# Patient Record
Sex: Female | Born: 1975 | Race: Black or African American | Hispanic: No | Marital: Single | Smoking: Former smoker
Health system: Southern US, Community
[De-identification: ages and names within clinical notes are randomized; demographics above are authoritative.]

## PROBLEM LIST (undated history)

## (undated) DIAGNOSIS — I10 Essential (primary) hypertension: Secondary | ICD-10-CM

## (undated) DIAGNOSIS — J45909 Unspecified asthma, uncomplicated: Secondary | ICD-10-CM

## (undated) DIAGNOSIS — J449 Chronic obstructive pulmonary disease, unspecified: Secondary | ICD-10-CM

---

## 2012-04-06 ENCOUNTER — Encounter (HOSPITAL_BASED_OUTPATIENT_CLINIC_OR_DEPARTMENT_OTHER): Payer: Self-pay

## 2012-04-06 ENCOUNTER — Emergency Department (HOSPITAL_BASED_OUTPATIENT_CLINIC_OR_DEPARTMENT_OTHER)
Admission: EM | Admit: 2012-04-06 | Discharge: 2012-04-06 | Disposition: A | Discharge status: Home / Self Care | Payer: Medicaid Other | Attending: Emergency Medicine | Admitting: Emergency Medicine

## 2012-04-06 ENCOUNTER — Emergency Department (HOSPITAL_BASED_OUTPATIENT_CLINIC_OR_DEPARTMENT_OTHER): Payer: Medicaid Other

## 2012-04-06 DIAGNOSIS — S93409A Sprain of unspecified ligament of unspecified ankle, initial encounter: Secondary | ICD-10-CM | POA: Insufficient documentation

## 2012-04-06 DIAGNOSIS — W010XXA Fall on same level from slipping, tripping and stumbling without subsequent striking against object, initial encounter: Secondary | ICD-10-CM | POA: Insufficient documentation

## 2012-04-06 DIAGNOSIS — S93401A Sprain of unspecified ligament of right ankle, initial encounter: Secondary | ICD-10-CM

## 2012-04-06 DIAGNOSIS — M25579 Pain in unspecified ankle and joints of unspecified foot: Secondary | ICD-10-CM | POA: Insufficient documentation

## 2012-04-06 MED ORDER — TRAMADOL HCL 50 MG PO TABS: 50.0000 mg | ORAL_TABLET | Freq: Four times a day (QID) | ORAL | Status: AC | PRN

## 2012-04-06 MED ORDER — KETOROLAC TROMETHAMINE 60 MG/2ML IM SOLN
60.0000 mg | Freq: Once | INTRAMUSCULAR | Status: AC
  Administered 2012-04-06: 60 mg via INTRAMUSCULAR
  Filled 2012-04-06: qty 2

## 2012-04-06 MED ORDER — IBUPROFEN 600 MG PO TABS: 600.0000 mg | ORAL_TABLET | Freq: Four times a day (QID) | ORAL | Status: AC | PRN

## 2012-04-06 NOTE — ED Notes (Signed)
MD at bedside. 

## 2012-04-06 NOTE — ED Notes (Signed)
Tripped early am-right foot pain/swelling

## 2012-04-06 NOTE — ED Provider Notes (Signed)
History     CSN: [id number]  Arrival date & time 04/06/12  1931   First MD Initiated Contact with Patient 04/06/12 2013      Chief Complaint  Patient presents with  . Foot Injury    (Consider location/radiation/quality/duration/timing/severity/associated sxs/prior treatment) HPI Pt turned R ankle last night and woke with lateral ankle pain and foot swelling. Pt has been ambulatory.  History reviewed. No pertinent past medical history.  History reviewed. No pertinent past surgical history.  No family history on file.  History  Substance Use Topics  . Smoking status: Current Everyday Smoker  . Smokeless tobacco: Not on file  . Alcohol Use: No    OB History    Grav Para Term Preterm Abortions TAB SAB Ect Mult Living                  Review of Systems  Musculoskeletal: Positive for arthralgias.  Skin: Negative for rash and wound.    Allergies  Review of patient's allergies indicates not on file.  Home Medications   Current Outpatient Rx  Name Route Sig Dispense Refill  . IBUPROFEN 600 MG PO TABS Oral Take 1 tablet (600 mg total) by mouth every 6 (six) hours as needed for pain. 30 tablet 0  . TRAMADOL HCL 50 MG PO TABS Oral Take 1 tablet (50 mg total) by mouth every 6 (six) hours as needed for pain. 15 tablet 0    Pulse 75  Temp 98.5 F (36.9 C) (Oral)  Resp 16  Ht 5\' 8"  (1.727 m)  Wt 170 lb (77.111 kg)  BMI 25.85 kg/m2  SpO2 100%  LMP 03/29/2012  Physical Exam  Nursing note and vitals reviewed. Constitutional: She is oriented to person, place, and time. She appears well-developed and well-nourished. No distress.  HENT:  Head: Normocephalic and atraumatic.  Neck: Normal range of motion. Neck supple.  Pulmonary/Chest: Effort normal.  Abdominal: Soft. Bowel sounds are normal.  Musculoskeletal: Normal range of motion. She exhibits tenderness (TTP over R lat mal and distal to lat mal. No deformity noted. + swelling to lateral foot. Neurovasc intact). She  exhibits no edema.  Neurological: She is alert and oriented to person, place, and time.  Skin: Skin is warm and dry. No rash noted. No erythema.  Psychiatric: She has a normal mood and affect. Her behavior is normal.    ED Course  Procedures (including critical care time)  Labs Reviewed - No data to display Dg Ankle Complete Right  04/06/2012  *RADIOLOGY REPORT*  Clinical Data: Fall with right ankle pain.  RIGHT ANKLE - COMPLETE 3+ VIEW  Comparison: None.  Findings: Soft tissue swelling is present laterally.  No fracture or dislocation.  Ankle mortise shows normal alignment.  No significant degenerative changes.  No bony lesions.  IMPRESSION: No acute fracture.  Original Report Authenticated By: Reola Calkins, M.D.   Dg Foot Complete Right  04/06/2012  *RADIOLOGY REPORT*  Clinical Data: Fall with right foot pain.  RIGHT FOOT COMPLETE - 3+ VIEW  Comparison:  None.  Findings:  There is no evidence of fracture or dislocation.  There is no evidence of arthropathy or other focal bone abnormality. Soft tissues are unremarkable.  IMPRESSION: Negative.  Original Report Authenticated By: Reola Calkins, M.D.     1. Right ankle sprain       MDM          Loren Racer, MD 04/06/12 2044

## 2012-09-22 ENCOUNTER — Encounter (HOSPITAL_BASED_OUTPATIENT_CLINIC_OR_DEPARTMENT_OTHER): Payer: Self-pay | Admitting: *Deleted

## 2012-09-22 DIAGNOSIS — I10 Essential (primary) hypertension: Secondary | ICD-10-CM | POA: Insufficient documentation

## 2012-09-22 DIAGNOSIS — W208XXA Other cause of strike by thrown, projected or falling object, initial encounter: Secondary | ICD-10-CM | POA: Insufficient documentation

## 2012-09-22 DIAGNOSIS — Y939 Activity, unspecified: Secondary | ICD-10-CM | POA: Insufficient documentation

## 2012-09-22 DIAGNOSIS — Y929 Unspecified place or not applicable: Secondary | ICD-10-CM | POA: Insufficient documentation

## 2012-09-22 DIAGNOSIS — S90129A Contusion of unspecified lesser toe(s) without damage to nail, initial encounter: Secondary | ICD-10-CM | POA: Insufficient documentation

## 2012-09-22 DIAGNOSIS — F17200 Nicotine dependence, unspecified, uncomplicated: Secondary | ICD-10-CM | POA: Insufficient documentation

## 2012-09-22 NOTE — ED Notes (Signed)
Pt states she dropped a jar on her right great toe earlier today. C/O pain to same.

## 2012-09-23 ENCOUNTER — Emergency Department (HOSPITAL_BASED_OUTPATIENT_CLINIC_OR_DEPARTMENT_OTHER): Payer: Medicaid Other

## 2012-09-23 ENCOUNTER — Emergency Department (HOSPITAL_BASED_OUTPATIENT_CLINIC_OR_DEPARTMENT_OTHER)
Admission: EM | Admit: 2012-09-23 | Discharge: 2012-09-23 | Disposition: A | Discharge status: Home / Self Care | Payer: Medicaid Other | Attending: Emergency Medicine | Admitting: Emergency Medicine

## 2012-09-23 DIAGNOSIS — S90129A Contusion of unspecified lesser toe(s) without damage to nail, initial encounter: Secondary | ICD-10-CM

## 2012-09-23 HISTORY — DX: Essential (primary) hypertension: I10

## 2012-09-23 MED ORDER — NAPROXEN 375 MG PO TABS: 375.0000 mg | ORAL_TABLET | Freq: Two times a day (BID) | ORAL | Status: DC

## 2012-09-23 NOTE — ED Notes (Signed)
Returned from xray

## 2012-09-23 NOTE — ED Notes (Signed)
Transported to xray 

## 2012-09-23 NOTE — ED Provider Notes (Signed)
History     CSN: [id number]  Arrival date & time 09/22/12  2211   First MD Initiated Contact with Patient 09/23/12 0053      Chief Complaint  Patient presents with  . Toe Injury    (Consider location/radiation/quality/duration/timing/severity/associated sxs/prior treatment) Patient is a 37 y.o. female presenting with toe pain. The history is provided by the patient.  Toe Pain This is a new problem. The current episode started 3 to 5 hours ago. The problem occurs constantly. The problem has not changed since onset.Pertinent negatives include no chest pain, no abdominal pain, no headaches and no shortness of breath. Nothing aggravates the symptoms. Nothing relieves the symptoms. She has tried nothing for the symptoms. The treatment provided no relief.  Dropped a jar on great toe  Past Medical History  Diagnosis Date  . Hypertension     History reviewed. No pertinent past surgical history.  History reviewed. No pertinent family history.  History  Substance Use Topics  . Smoking status: Current Every Day Smoker  . Smokeless tobacco: Not on file  . Alcohol Use: No    OB History    Grav Para Term Preterm Abortions TAB SAB Ect Mult Living                  Review of Systems  Respiratory: Negative for shortness of breath.   Cardiovascular: Negative for chest pain.  Gastrointestinal: Negative for abdominal pain.  Neurological: Negative for headaches.  All other systems reviewed and are negative.    Allergies  Review of patient's allergies indicates not on file.  Home Medications  No current outpatient prescriptions on file.  BP 153/105  Pulse 65  Temp 98.8 F (37.1 C) (Oral)  Resp 23  Ht 5\' 6"  (1.676 m)  Wt 170 lb (77.111 kg)  BMI 27.44 kg/m2  SpO2 100%  LMP 09/12/2012  Physical Exam  Constitutional: She is oriented to person, place, and time. She appears well-developed and well-nourished. No distress.  HENT:  Head: Normocephalic and atraumatic.  Eyes:  Conjunctivae normal are normal. Pupils are equal, round, and reactive to light.  Neck: Normal range of motion. Neck supple.  Cardiovascular: Normal rate, regular rhythm and intact distal pulses.   Pulmonary/Chest: Effort normal and breath sounds normal. She has no wheezes. She has no rales.  Abdominal: Soft. Bowel sounds are normal. There is no tenderness. There is no rebound and no guarding.  Musculoskeletal: Normal range of motion. She exhibits no edema.  Neurological: She is alert and oriented to person, place, and time. She has normal reflexes.  Skin: Skin is warm and dry.  Psychiatric: She has a normal mood and affect.    ED Course  Procedures (including critical care time)  Labs Reviewed - No data to display Dg Toe Great Right  09/23/2012  *RADIOLOGY REPORT*  Clinical Data: Dropped jar on right great toe, with pain at the lateral aspect of the right great toe.  RIGHT GREAT TOE  Comparison: None.  Findings: There is no evidence of osseous disruption.  The nailbed is difficult to fully characterize on radiograph.  No radiopaque foreign bodies are seen.  Mild soft tissue swelling is suggested at the distal great toe.  Visualized joint spaces are preserved.  IMPRESSION: No evidence of osseous disruption; no radiopaque foreign bodies seen.   Original Report Authenticated By: Tonia Ghent, M.D.      No diagnosis found.    MDM  Toe contusion.  Pain medication ice and elevation  Jasmine Awe, MD 09/23/12 [phone]

## 2013-07-24 ENCOUNTER — Other Ambulatory Visit: Payer: Self-pay | Admitting: Emergency Medicine

## 2013-07-24 ENCOUNTER — Emergency Department (HOSPITAL_BASED_OUTPATIENT_CLINIC_OR_DEPARTMENT_OTHER): Payer: Medicaid Other

## 2013-07-24 ENCOUNTER — Encounter (HOSPITAL_COMMUNITY): Payer: Self-pay | Admitting: *Deleted

## 2013-07-24 ENCOUNTER — Inpatient Hospital Stay (HOSPITAL_BASED_OUTPATIENT_CLINIC_OR_DEPARTMENT_OTHER)
Admission: EM | Admit: 2013-07-24 | Discharge: 2013-07-27 | DRG: 189 | Disposition: A | Discharge status: Home / Self Care | Payer: Medicaid Other | Attending: Internal Medicine | Admitting: Internal Medicine

## 2013-07-24 DIAGNOSIS — J9600 Acute respiratory failure, unspecified whether with hypoxia or hypercapnia: Principal | ICD-10-CM

## 2013-07-24 DIAGNOSIS — R05 Cough: Secondary | ICD-10-CM

## 2013-07-24 DIAGNOSIS — R0600 Dyspnea, unspecified: Secondary | ICD-10-CM

## 2013-07-24 DIAGNOSIS — R0602 Shortness of breath: Secondary | ICD-10-CM

## 2013-07-24 DIAGNOSIS — R0902 Hypoxemia: Secondary | ICD-10-CM

## 2013-07-24 DIAGNOSIS — F17200 Nicotine dependence, unspecified, uncomplicated: Secondary | ICD-10-CM | POA: Diagnosis present

## 2013-07-24 DIAGNOSIS — J45909 Unspecified asthma, uncomplicated: Secondary | ICD-10-CM | POA: Diagnosis present

## 2013-07-24 DIAGNOSIS — R059 Cough, unspecified: Secondary | ICD-10-CM

## 2013-07-24 DIAGNOSIS — I10 Essential (primary) hypertension: Secondary | ICD-10-CM | POA: Diagnosis present

## 2013-07-24 DIAGNOSIS — I7779 Dissection of other artery: Secondary | ICD-10-CM

## 2013-07-24 DIAGNOSIS — Z720 Tobacco use: Secondary | ICD-10-CM

## 2013-07-24 DIAGNOSIS — R0609 Other forms of dyspnea: Secondary | ICD-10-CM

## 2013-07-24 LAB — PRO B NATRIURETIC PEPTIDE: Pro B Natriuretic peptide (BNP): 409.1 pg/mL — ABNORMAL HIGH (ref 0–125)

## 2013-07-24 MED ORDER — ALBUTEROL SULFATE (5 MG/ML) 0.5% IN NEBU
2.5000 mg | INHALATION_SOLUTION | RESPIRATORY_TRACT | Status: AC
  Administered 2013-07-24: 5 mg via RESPIRATORY_TRACT

## 2013-07-24 MED ORDER — ONDANSETRON HCL 4 MG PO TABS: 4.0000 mg | ORAL_TABLET | Freq: Four times a day (QID) | ORAL | Status: DC | PRN

## 2013-07-24 MED ORDER — SODIUM CHLORIDE 0.9 % IJ SOLN
3.0000 mL | Freq: Two times a day (BID) | INTRAMUSCULAR | Status: DC
  Administered 2013-07-24 (×2): 3 mL via INTRAVENOUS

## 2013-07-24 MED ORDER — ONDANSETRON HCL 4 MG/2ML IJ SOLN: 4.0000 mg | Freq: Four times a day (QID) | INTRAMUSCULAR | Status: DC | PRN

## 2013-07-24 MED ORDER — SODIUM CHLORIDE 0.9 % IJ SOLN: 3.0000 mL | INTRAMUSCULAR | Status: DC | PRN

## 2013-07-24 MED ORDER — IPRATROPIUM BROMIDE 0.02 % IN SOLN
RESPIRATORY_TRACT | Status: AC
  Administered 2013-07-24: 0.5 mg via RESPIRATORY_TRACT
  Filled 2013-07-24: qty 2.5

## 2013-07-24 MED ORDER — ALBUTEROL SULFATE (5 MG/ML) 0.5% IN NEBU
2.5000 mg | INHALATION_SOLUTION | RESPIRATORY_TRACT | Status: DC | PRN
  Administered 2013-07-24 – 2013-07-25 (×3): 2.5 mg via RESPIRATORY_TRACT
  Filled 2013-07-24 (×5): qty 0.5

## 2013-07-24 MED ORDER — SODIUM CHLORIDE 0.9 % IJ SOLN
3.0000 mL | Freq: Two times a day (BID) | INTRAMUSCULAR | Status: DC
  Administered 2013-07-25 – 2013-07-26 (×2): 3 mL via INTRAVENOUS

## 2013-07-24 MED ORDER — BENZONATATE 100 MG PO CAPS
100.0000 mg | ORAL_CAPSULE | Freq: Three times a day (TID) | ORAL | Status: DC | PRN
  Administered 2013-07-24 – 2013-07-25 (×3): 100 mg via ORAL
  Filled 2013-07-24 (×4): qty 1

## 2013-07-24 MED ORDER — ACETAMINOPHEN 325 MG PO TABS
650.0000 mg | ORAL_TABLET | Freq: Four times a day (QID) | ORAL | Status: DC | PRN
  Administered 2013-07-24 – 2013-07-25 (×2): 650 mg via ORAL
  Filled 2013-07-24 (×2): qty 2

## 2013-07-24 MED ORDER — IOHEXOL 350 MG/ML SOLN
80.0000 mL | Freq: Once | INTRAVENOUS | Status: AC | PRN
  Administered 2013-07-24: 80 mL via INTRAVENOUS

## 2013-07-24 MED ORDER — SODIUM CHLORIDE 0.9 % IV SOLN: 250.0000 mL | INTRAVENOUS | Status: DC | PRN

## 2013-07-24 MED ORDER — RACEPINEPHRINE HCL 2.25 % IN NEBU
0.5000 mL | INHALATION_SOLUTION | Freq: Once | RESPIRATORY_TRACT | Status: AC
  Administered 2013-07-24: 0.5 mL via RESPIRATORY_TRACT

## 2013-07-24 MED ORDER — ALBUTEROL SULFATE (5 MG/ML) 0.5% IN NEBU
2.5000 mg | INHALATION_SOLUTION | Freq: Once | RESPIRATORY_TRACT | Status: AC
  Administered 2013-07-24: 2.5 mg via RESPIRATORY_TRACT

## 2013-07-24 MED ORDER — IPRATROPIUM BROMIDE 0.02 % IN SOLN
0.5000 mg | RESPIRATORY_TRACT | Status: AC
  Administered 2013-07-24: 0.5 mg via RESPIRATORY_TRACT

## 2013-07-24 MED ORDER — IPRATROPIUM BROMIDE 0.02 % IN SOLN
0.5000 mg | RESPIRATORY_TRACT | Status: DC
  Administered 2013-07-24 – 2013-07-25 (×5): 0.5 mg via RESPIRATORY_TRACT
  Filled 2013-07-24 (×4): qty 2.5

## 2013-07-24 MED ORDER — SALINE SPRAY 0.65 % NA SOLN
1.0000 | NASAL | Status: DC | PRN
  Administered 2013-07-25: 1 via NASAL
  Filled 2013-07-24 (×2): qty 44

## 2013-07-24 MED ORDER — ALBUTEROL SULFATE (5 MG/ML) 0.5% IN NEBU: 2.5000 mg | INHALATION_SOLUTION | RESPIRATORY_TRACT | Status: DC | PRN

## 2013-07-24 MED ORDER — RACEPINEPHRINE HCL 2.25 % IN NEBU
INHALATION_SOLUTION | RESPIRATORY_TRACT | Status: AC
  Administered 2013-07-24: 0.5 mL via RESPIRATORY_TRACT
  Filled 2013-07-24: qty 0.5

## 2013-07-24 MED ORDER — ALBUTEROL SULFATE (5 MG/ML) 0.5% IN NEBU
5.0000 mg | INHALATION_SOLUTION | Freq: Once | RESPIRATORY_TRACT | Status: AC
  Administered 2013-07-24: 5 mg via RESPIRATORY_TRACT

## 2013-07-24 MED ORDER — METHYLPREDNISOLONE SODIUM SUCC 125 MG IJ SOLR
60.0000 mg | Freq: Four times a day (QID) | INTRAMUSCULAR | Status: DC
  Administered 2013-07-24 – 2013-07-25 (×4): 60 mg via INTRAVENOUS
  Filled 2013-07-24: qty 0.96
  Filled 2013-07-24: qty 2
  Filled 2013-07-24 (×8): qty 0.96
  Filled 2013-07-24: qty 2
  Filled 2013-07-24 (×2): qty 0.96

## 2013-07-24 NOTE — ED Notes (Signed)
Plan for this patient to have her admitted to the hospital. Currently getting CT angio of chest.

## 2013-07-24 NOTE — ED Notes (Signed)
[facility] at bedside 

## 2013-07-24 NOTE — H&P (Signed)
[facility] History and Physical  [name] [name] ZOX:[id number] DOB: 1975/10/07 DOA: 07/24/2013  Referring physician: er PCP: No primary provider on file.  Specialists:   Chief Complaint: sob/cough  HPI: [name] [name] is a 37 y.o. female  Who presents with 2 days of SOB.  She is a smoker but has been unable to smoke due to SOB.  No fevers, no chills.  No chest pain but patient did have epigastric pain.  That comes and goes and she describes as "burning".   She also had n/v 2 days ago.  In the Er, she was found to be 85% on room air.  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  [facility] and acute resp failure    Review of Systems: all systems reviewed, negative unless stated above   Past Medical History  Diagnosis Date  . Hypertension    History reviewed. No pertinent past surgical history. Social History:  reports that she has been smoking Cigarettes.  She has been smoking about 0.50 packs per day. She has never used smokeless tobacco. She reports that she does not drink alcohol or use illicit drugs.  No Known Allergies  History reviewed. No pertinent family history.  Prior to Admission medications   Medication Sig Start Date End Date Taking? Authorizing Provider  naproxen (NAPROSYN) 375 MG tablet Take 1 tablet (375 mg total) by mouth 2 (two) times daily. 09/23/12   April Smitty Cords, MD   Physical Exam: [name] [name] Vitals:   07/24/13 0633  BP: 122/73  Pulse: 63  Temp: 98.3 F (36.8 C)  Resp:      General:  A+Ox3, NAD- on 2L O2  Eyes: wnl  ENT: petechiae of palate  Neck: supple  Cardiovascular: rrr  Respiratory: decreased BS and b/L wheezing  Abdomen: +BS, soft, NT  Skin: no rashes or lesions  Musculoskeletal: moves all 4 ext  Psychiatric: normal mood affect  Neurologic: CN 2-12 intact  Labs on Admission:  Basic Metabolic Panel: No  results found for this basename: NA, K, CL, CO2, GLUCOSE, BUN, CREATININE, CALCIUM, MG, PHOS,  in the last 168 hours Liver Function Tests: No results found for this basename: AST, ALT, ALKPHOS, BILITOT, PROT, ALBUMIN,  in the last 168 hours No results found for this basename: LIPASE, AMYLASE,  in the last 168 hours No results found for this basename: AMMONIA,  in the last 168 hours CBC: No results found for this basename: WBC, NEUTROABS, HGB, HCT, MCV, PLT,  in the last 168 hours Cardiac Enzymes: No results found for this basename: CKTOTAL, CKMB, CKMBINDEX, TROPONINI,  in the last 168 hours  BNP (last 3 results)  Recent Labs  07/24/13 0250  PROBNP 409.1*   CBG: No results found for this basename: GLUCAP,  in the last 168 hours  Radiological Exams on Admission: Dg Neck Soft Tissue  07/24/2013   CLINICAL DATA:  Cough, wheezing, shortness of breath, current smoker  EXAM: NECK SOFT TISSUES - 1+ VIEW  COMPARISON:  None  FINDINGS: Prevertebral soft tissues normal thickness.  Epiglottis normal thickness.  Aryepiglottic folds appear questionably minimally thickened.  Airway patent.  Osseous structures unremarkable.  No radiopaque foreign body identified.  IMPRESSION: Question mild thickening of the aryepiglottic folds.  Remainder of exam unremarkable.   Electronically Signed   By: Ulyses Southward M.D.   On: 07/24/2013 01:39   Dg Chest 2  View  07/24/2013   CLINICAL DATA:  Cough, shortness of breath and wheezing.  EXAM: CHEST  2 VIEW  COMPARISON:  None available for comparison at time of study interpretation.  FINDINGS: Cardiomediastinal silhouette is unremarkable. The lungs are clear without pleural effusions or focal consolidations. Pulmonary vasculature is unremarkable. Trachea projects midline and there is no pneumothorax. Soft tissue planes and included osseous structures are nonsuspicious. Multiple EKG lines overlie the patient and may obscure subtle underlying pathology.  IMPRESSION: No acute  cardiopulmonary process.   Electronically Signed   By: Awilda Metro   On: 07/24/2013 01:41   Ct Angio Chest Pe W/cm &/or Wo Cm  07/24/2013   CLINICAL DATA:  Shortness of breath.  EXAM: CT ANGIOGRAPHY CHEST WITH CONTRAST  TECHNIQUE: Multidetector CT imaging of the chest was performed using the standard protocol during bolus administration of intravenous contrast. Multiplanar CT image reconstructions including MIPs were obtained to evaluate the vascular anatomy.  CONTRAST:  80mL OMNIPAQUE IOHEXOL 350 MG/ML SOLN  COMPARISON:  None.  FINDINGS: THORACIC INLET/BODY WALL:  No acute abnormality.  MEDIASTINUM:  Normal heart size. No pericardial effusion. No acute vascular abnormality, including pulmonary embolism. Incidental aortic ductus bump. No adenopathy.  LUNG WINDOWS:  No consolidation.  No effusion.  No suspicious pulmonary nodule.  UPPER ABDOMEN:  There is a linear filling defect within the celiac axis which is most consistent with a dissection. This section of the artery is mildly dilated to 11 mm diameter. No related flow limitation. No evidence of visceral ischemia in the upper abdomen.  OSSEOUS:  Negative.  Review of the MIP images confirms the above findings.  IMPRESSION: 1. Negative for pulmonary embolism or other acute intrathoracic abnormality. 2. 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 Is there history of connective tissue disease or remote abdominal trauma?   Electronically Signed   By: Tiburcio Pea M.D.   On: 07/24/2013 03:44      Assessment/Plan Active Problems:   SOB (shortness of breath)   Cough   Acute respiratory failure   Tobacco abuse   1. SOB- IV steroids, nebs, NP swab 2. Cough- symptomatic treatment 3. Acute resp failure- O2- wean as tolerated 4. foacl dissection of celiac axis- vascular- no further intervention  Vascular via phone [name] [name])- nothing to be concerned with  Code Status: full Family Communication: patient Disposition Plan:  obs Time spent: 75 min  [name] [name] [facility] Pager [phone]  If 7PM-7AM, please contact night-coverage [email or web address] Password [facility] 07/24/2013, 7:43 AM

## 2013-07-24 NOTE — ED Provider Notes (Addendum)
Nursing notes and vitals signs, including pulse oximetry, reviewed.  Summary of this visit's results, reviewed by myself:  Imaging Studies: Dg Neck Soft Tissue  07/24/2013   CLINICAL DATA:  Cough, wheezing, shortness of breath, current smoker  EXAM: NECK SOFT TISSUES - 1+ VIEW  COMPARISON:  None  FINDINGS: Prevertebral soft tissues normal thickness.  Epiglottis normal thickness.  Aryepiglottic folds appear questionably minimally thickened.  Airway patent.  Osseous structures unremarkable.  No radiopaque foreign body identified.  IMPRESSION: Question mild thickening of the aryepiglottic folds.  Remainder of exam unremarkable.   Electronically Signed   By: Ulyses Southward M.D.   On: 07/24/2013 01:39   Dg Chest 2 View  07/24/2013   CLINICAL DATA:  Cough, shortness of breath and wheezing.  EXAM: CHEST  2 VIEW  COMPARISON:  None available for comparison at time of study interpretation.  FINDINGS: Cardiomediastinal silhouette is unremarkable. The lungs are clear without pleural effusions or focal consolidations. Pulmonary vasculature is unremarkable. Trachea projects midline and there is no pneumothorax. Soft tissue planes and included osseous structures are nonsuspicious. Multiple EKG lines overlie the patient and may obscure subtle underlying pathology.  IMPRESSION: No acute cardiopulmonary process.   Electronically Signed   By: Awilda Metro   On: 07/24/2013 01:41   Ct Angio Chest Pe W/cm &/or Wo Cm  07/24/2013   CLINICAL DATA:  Shortness of breath.  EXAM: CT ANGIOGRAPHY CHEST WITH CONTRAST  TECHNIQUE: Multidetector CT imaging of the chest was performed using the standard protocol during bolus administration of intravenous contrast. Multiplanar CT image reconstructions including MIPs were obtained to evaluate the vascular anatomy.  CONTRAST:  80mL OMNIPAQUE IOHEXOL 350 MG/ML SOLN  COMPARISON:  None.  FINDINGS: THORACIC INLET/BODY WALL:  No acute abnormality.  MEDIASTINUM:  Normal heart size. No pericardial  effusion. No acute vascular abnormality, including pulmonary embolism. Incidental aortic ductus bump. No adenopathy.  LUNG WINDOWS:  No consolidation.  No effusion.  No suspicious pulmonary nodule.  UPPER ABDOMEN:  There is a linear filling defect within the celiac axis which is most consistent with a dissection. This section of the artery is mildly dilated to 11 mm diameter. No related flow limitation. No evidence of visceral ischemia in the upper abdomen.  OSSEOUS:  Negative.  Review of the MIP images confirms the above findings.  IMPRESSION: 1. Negative for pulmonary embolism or other acute intrathoracic abnormality. 2. 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 Is there history of connective tissue disease or remote abdominal trauma?   Electronically Signed   By: Tiburcio Pea M.D.   On: 07/24/2013 03:44   1:45 AM Wheezing and stridor resolved after albuterol and Atrovent neb treatment. Patient is subjectively feels better. Given x-ray findings we will additionally give racemic epinephrine and dexamethasone.  2:31 AM Oxygen saturation drops to 85% on room air. Wheezing has return. We will get her admitted.   3:07 AM Admitting hospitalist, Dr. Adela Glimpse, requests at CT of the chest.  3:45 AM Patient advised of CT findings, including celiac artery dissection. Patient then volunteered that she's been having "heartburn pain "in the epigastrium for several months.  Hanley Seamen, MD 07/24/13 0235  Hanley Seamen, MD 07/24/13 2130  Hanley Seamen, MD 07/24/13 [id number]

## 2013-07-24 NOTE — Progress Notes (Signed)
Utilization Review Completed.[name] [name] T11/10/2012  

## 2013-07-24 NOTE — ED Notes (Signed)
Pt taken off O2. Sats dropped to 87%. Pt placed back on 2lt [place]. Sats now 93%

## 2013-07-24 NOTE — ED Notes (Signed)
Pt arrived during downtime, initial encounter including nursing note, triage note, physician orders are charted in downtime chart. Please refer to downtime chart for any missing documentation on epic.

## 2013-07-24 NOTE — ED Notes (Signed)
From this point on, all charting will now be on EPIC

## 2013-07-24 NOTE — ED Notes (Signed)
Assigned to bed 3W18 @ [facility], RN notified, [facility] called for transport.

## 2013-07-25 DIAGNOSIS — F17200 Nicotine dependence, unspecified, uncomplicated: Secondary | ICD-10-CM

## 2013-07-25 LAB — BASIC METABOLIC PANEL
BUN: 10 mg/dL (ref 6–23)
CO2: 25 mEq/L (ref 19–32)
Chloride: 107 mEq/L (ref 96–112)
GFR calc non Af Amer: >90 mL/min (ref >90)
Glucose, Bld: 150 mg/dL — ABNORMAL HIGH (ref 70–99)
Potassium: 3.9 mEq/L (ref 3.5–5.1)
Sodium: 141 mEq/L (ref 135–145)

## 2013-07-25 LAB — CBC
HCT: 41.4 % (ref 36.0–46.0)
Hemoglobin: 14.7 g/dL (ref 12.0–15.0)
MCH: 33.3 pg (ref 26.0–34.0)
MCHC: 35.5 g/dL (ref 30.0–36.0)
MCV: 93.9 fL (ref 78.0–100.0)
RBC: 4.41 MIL/uL (ref 3.87–5.11)

## 2013-07-25 MED ORDER — ALBUTEROL SULFATE (5 MG/ML) 0.5% IN NEBU
2.5000 mg | INHALATION_SOLUTION | RESPIRATORY_TRACT | Status: DC | PRN
  Administered 2013-07-26: 2.5 mg via RESPIRATORY_TRACT

## 2013-07-25 MED ORDER — METHYLPREDNISOLONE SODIUM SUCC 40 MG IJ SOLR
40.0000 mg | Freq: Four times a day (QID) | INTRAMUSCULAR | Status: DC
  Administered 2013-07-25 – 2013-07-27 (×8): 40 mg via INTRAVENOUS
  Filled 2013-07-25 (×13): qty 1

## 2013-07-25 MED ORDER — IPRATROPIUM BROMIDE 0.02 % IN SOLN: 0.5000 mg | Freq: Four times a day (QID) | RESPIRATORY_TRACT | Status: DC | PRN

## 2013-07-25 NOTE — Progress Notes (Signed)
[facility] PROGRESS NOTE  [name] [name] UJW:[id number] DOB: 07/07/1976 DOA: 07/24/2013 PCP: No primary provider on file.  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   Code Status: full Family Communication: patient Disposition Plan:     Consultants:  Vascular- on phone  Procedures:    Antibiotics:    HPI/Subjective: Breathing better Still SOB without oxygen  Objective: Filed Vitals:   07/25/13 0628  BP: 132/70  Pulse: 63  Temp: 98.5 F (36.9 C)  Resp: 18    Intake/Output Summary (Last 24 hours) at 07/25/13 0923 Last data filed at 07/24/13 1300  Gross per 24 hour  Intake    480 ml  Output      0 ml  Net    480 ml   Filed Weights   07/24/13 0633  Weight: 75.7 kg (166 lb 14.2 oz)    Exam:   General:  A+Ox3, NAd  Cardiovascular: rrr  Respiratory: diminished, no wheezing  Abdomen: +BS, soft  Musculoskeletal: moves all 4 ext   Data Reviewed: Basic Metabolic Panel:  Recent Labs Lab 07/25/13 0555  NA 141  K 3.9  CL 107  CO2 25  GLUCOSE 150*  BUN 10  CREATININE 0.69  CALCIUM 9.8   Liver Function Tests: No results found for this basename: AST, ALT, ALKPHOS, BILITOT, PROT, ALBUMIN,  in the last 168 hours No results found for this basename: LIPASE, AMYLASE,  in the last 168 hours No results found for this basename: AMMONIA,  in the last 168 hours CBC:  Recent Labs Lab 07/25/13 0555  WBC 15.4*  HGB 14.7  HCT 41.4  MCV 93.9  PLT 355   Cardiac Enzymes: No results found for this basename: CKTOTAL, CKMB, CKMBINDEX, TROPONINI,  in the last 168 hours BNP (last 3 results)  Recent Labs  07/24/13 0250  PROBNP 409.1*   CBG: No results found for this basename: GLUCAP,  in the last 168 hours  No results found for this or any previous visit (from the past 240 hour(s)).    Studies: Dg Neck Soft Tissue  07/24/2013   CLINICAL DATA:  Cough, wheezing, shortness of breath, current smoker  EXAM: NECK SOFT TISSUES - 1+ VIEW  COMPARISON:  None  FINDINGS: Prevertebral soft tissues normal thickness.  Epiglottis normal thickness.  Aryepiglottic folds appear questionably minimally thickened.  Airway patent.  Osseous structures unremarkable.  No radiopaque foreign body identified.  IMPRESSION: Question mild thickening of the aryepiglottic folds.  Remainder of exam unremarkable.   Electronically Signed   By: Ulyses Southward M.D.   On: 07/24/2013 01:39   Dg Chest 2 View  07/24/2013   CLINICAL DATA:  Cough, shortness of breath and wheezing.  EXAM: CHEST  2 VIEW  COMPARISON:  None available for comparison at time of study interpretation.  FINDINGS: Cardiomediastinal silhouette is unremarkable. The lungs are clear without pleural effusions or focal consolidations. Pulmonary vasculature is unremarkable. Trachea projects midline and there is no pneumothorax. Soft tissue planes and included osseous structures are nonsuspicious. Multiple EKG lines overlie the patient and may obscure subtle underlying pathology.  IMPRESSION: No acute cardiopulmonary process.   Electronically Signed   By: Awilda Metro   On: 07/24/2013 01:41   Ct Angio Chest Pe W/cm &/or Wo Cm  07/24/2013   CLINICAL DATA:  Shortness of breath.  EXAM: CT ANGIOGRAPHY CHEST WITH CONTRAST  TECHNIQUE:  Multidetector CT imaging of the chest was performed using the standard protocol during bolus administration of intravenous contrast. Multiplanar CT image reconstructions including MIPs were obtained to evaluate the vascular anatomy.  CONTRAST:  80mL OMNIPAQUE IOHEXOL 350 MG/ML SOLN  COMPARISON:  None.  FINDINGS: THORACIC INLET/BODY WALL:  No acute abnormality.  MEDIASTINUM:  Normal heart size. No pericardial effusion. No acute vascular abnormality, including pulmonary embolism. Incidental aortic ductus bump. No adenopathy.  LUNG WINDOWS:   No consolidation.  No effusion.  No suspicious pulmonary nodule.  UPPER ABDOMEN:  There is a linear filling defect within the celiac axis which is most consistent with a dissection. This section of the artery is mildly dilated to 11 mm diameter. No related flow limitation. No evidence of visceral ischemia in the upper abdomen.  OSSEOUS:  Negative.  Review of the MIP images confirms the above findings.  IMPRESSION: 1. Negative for pulmonary embolism or other acute intrathoracic abnormality. 2. 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 Is there history of connective tissue disease or remote abdominal trauma?   Electronically Signed   By: Tiburcio Pea M.D.   On: 07/24/2013 03:44    Scheduled Meds: . ipratropium  0.5 mg Nebulization Q4H  . methylPREDNISolone (SOLU-MEDROL) injection  60 mg Intravenous Q6H  . sodium chloride  3 mL Intravenous Q12H  . sodium chloride  3 mL Intravenous Q12H   Continuous Infusions:   Active Problems:   SOB (shortness of breath)   Cough   Acute respiratory failure   Tobacco abuse    Time spent: 35 min    [name] [name]  [facility] Pager [phone]. If 7PM-7AM, please contact night-coverage at [email or web address], password [place] 07/25/2013, 9:23 AM  LOS: 1 day

## 2013-07-26 DIAGNOSIS — R0902 Hypoxemia: Secondary | ICD-10-CM

## 2013-07-26 NOTE — Progress Notes (Signed)
[facility] PROGRESS NOTE  [name] [name] UEA:[id number] DOB: 01/27/1976 DOA: 07/24/2013 PCP: No primary provider on file.  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- no further intervention  Tobacco abuse: plans to quit  Code Status: full Family Communication: patient Disposition Plan: home 1-2 days    Consultants:  Vascular- on phone  Procedures:    Antibiotics:    HPI/Subjective: Breathing better today Still SOB with exertion Plans to quit smoking  Objective: Filed Vitals:   07/26/13 0457  BP: 106/65  Pulse: 58  Temp: 97.9 F (36.6 C)  Resp: 18   No intake or output data in the 24 hours ending 07/26/13 0919 Filed Weights   07/24/13 4782  Weight: 75.7 kg (166 lb 14.2 oz)    Exam:   General:  A+Ox3, NAd  Cardiovascular: rrr  Respiratory: moving more air, no wheezing  Abdomen: +BS, soft  Musculoskeletal: moves all 4 ext   Data Reviewed: Basic Metabolic Panel:  Recent Labs Lab 07/25/13 0555  NA 141  K 3.9  CL 107  CO2 25  GLUCOSE 150*  BUN 10  CREATININE 0.69  CALCIUM 9.8   Liver Function Tests: No results found for this basename: AST, ALT, ALKPHOS, BILITOT, PROT, ALBUMIN,  in the last 168 hours No results found for this basename: LIPASE, AMYLASE,  in the last 168 hours No results found for this basename: AMMONIA,  in the last 168 hours CBC:  Recent Labs Lab 07/25/13 0555  WBC 15.4*  HGB 14.7  HCT 41.4  MCV 93.9  PLT 355   Cardiac Enzymes: No results found for this basename: CKTOTAL, CKMB, CKMBINDEX, TROPONINI,  in the last 168 hours BNP (last 3 results)  Recent Labs  07/24/13 0250  PROBNP 409.1*   CBG: No results found for this basename: GLUCAP,  in the last 168 hours  No results found for this or any previous visit (from the past 240 hour(s)).    Studies: No results found.  Scheduled Meds: . methylPREDNISolone (SOLU-MEDROL) injection  40 mg Intravenous Q6H  . sodium chloride  3 mL Intravenous Q12H  . sodium chloride  3 mL Intravenous Q12H   Continuous Infusions:   Active Problems:   SOB (shortness of breath)   Cough   Acute respiratory failure   Tobacco abuse    Time spent: 35 min    Cheralyn Oliver  [facility] Pager [phone]. If 7PM-7AM, please contact night-coverage at [email or web address], password [email or web address] 07/26/2013, 9:19 AM  LOS: 2 days

## 2013-07-27 LAB — EPSTEIN-BARR VIRUS VCA, IGM: EBV VCA IgM: <10 U/mL (ref <36.0)

## 2013-07-27 MED ORDER — PREDNISONE 50 MG PO TABS
60.0000 mg | ORAL_TABLET | Freq: Once | ORAL | Status: AC
  Administered 2013-07-27: 60 mg via ORAL
  Filled 2013-07-27: qty 1

## 2013-07-27 MED ORDER — ALBUTEROL SULFATE HFA 108 (90 BASE) MCG/ACT IN AERS
2.0000 | INHALATION_SPRAY | Freq: Four times a day (QID) | RESPIRATORY_TRACT | Status: DC | PRN
  Administered 2013-07-27: 2 via RESPIRATORY_TRACT
  Filled 2013-07-27: qty 6.7

## 2013-07-27 MED ORDER — NICOTINE 21 MG/24HR TD PT24: 21.0000 mg | MEDICATED_PATCH | Freq: Every day | TRANSDERMAL | Status: DC

## 2013-07-27 MED ORDER — ALBUTEROL SULFATE HFA 108 (90 BASE) MCG/ACT IN AERS: 2.0000 | INHALATION_SPRAY | Freq: Four times a day (QID) | RESPIRATORY_TRACT | Status: DC | PRN

## 2013-07-27 MED ORDER — NICOTINE 21 MG/24HR TD PT24
21.0000 mg | MEDICATED_PATCH | Freq: Every day | TRANSDERMAL | Status: DC
  Administered 2013-07-27: 21 mg via TRANSDERMAL
  Filled 2013-07-27: qty 1

## 2013-07-27 MED ORDER — PREDNISONE (PAK) 10 MG PO TABS: ORAL_TABLET | Freq: Every day | ORAL | Status: DC

## 2013-07-27 MED ORDER — BENZONATATE 100 MG PO CAPS: 100.0000 mg | ORAL_CAPSULE | Freq: Three times a day (TID) | ORAL | Status: DC | PRN

## 2013-07-27 NOTE — Discharge Summary (Signed)
Physician Discharge Summary  Patient ID: [name] [name] MRN: [id number] DOB/AGE: [birth year] 37 y.o.  Admit date: 07/24/2013 Discharge date: 07/27/2013  Primary Care Physician:  No primary provider on file.  Discharge Diagnoses:   Acute respiratory failure with hypoxia resolved Acute reactive airway disease possible asthma- improved Focal dissection of celiac axis Nicotine abuse  Consults: Vascular surgery, Dr. Hart Rochester by Dr. Shanda Bumps ran over the phone for the focal dissection of the celiac axis   Recommendations for Outpatient Follow-up:  1. patient was strongly recommended nicotine cessation, referral to pulmonology by PCP. 2 Patient was also given resources and information for her primary care physicians in [facility] For followup. She will likely need PFTs for further diagnosis.  Allergies:  No Known Allergies   Discharge Medications:   Medication List         albuterol 108 (90 BASE) MCG/ACT inhaler  Commonly known as:  PROVENTIL HFA;VENTOLIN HFA  Inhale 2 puffs into the lungs every 6 (six) hours as needed for wheezing or shortness of breath.     benzonatate 100 MG capsule  Commonly known as:  TESSALON  Take 1 capsule (100 mg total) by mouth 3 (three) times daily as needed for cough.     nicotine 21 mg/24hr patch  Commonly known as:  NICODERM CQ - dosed in mg/24 hours  Place 1 patch (21 mg total) onto the skin daily.     predniSONE 10 MG tablet  Commonly known as:  STERAPRED UNI-PAK  Take by mouth daily. Take as directed on the pack         Brief H and P: For complete details please refer to admission H and P, but in brief the patient is a 37 year old female who presented with 2 days of shortness of breath, she is a smoker. Denied any fevers or chills or any chest pain. She had some nausea and vomiting 2 days prior to admission. In the ER patient had O2 sats 85% on room air. CT scan of her abdomen was done which showed no PE but focal dissection of the celiac axis  without any flow limitation. Patient was sent to [place] for vascular  surgery consult and acute respirator failure.  Hospital Course:  Acute hypoxic respiratory failure with acute reactive airway disease: Possible asthma, patient was admitted, placed on scheduled nebulizer treatments, IV steroids. She had significant improvement with the Solu-Medrol and nebulizer treatments. The patient was recommended to use albuterol inhaler as needed, prednisone Sterapred pack. She was also recommended to have PCP followup and information regarding PCP'S was given to the patient as she would require pulmonology referral for PFTs and a formal diagnosis for possible asthma or COPD. Patient was strongly counseled for smoking cessation and nicotine patches were provided as a prescription.  Abdominal pain: Resolved, CT abdomen and pelvis showed no PE but focal dissection of the celiac axis without any flow limitation. Dr. Shanda Bumps been discussed with vascular surgery, Dr. Hart Rochester who did not recommend any vascular surgery intervention.  Day of Discharge BP 129/84  Pulse 54  Temp(Src) 98.8 F (37.1 C) (Oral)  Resp 18  Ht 5\' 6"  (1.676 m)  Wt 75.7 kg (166 lb 14.2 oz)  BMI 26.95 kg/m2  SpO2 97%  Physical Exam: General: Alert and awake oriented x3 not in any acute distress. CVS: S1-S2 clear no murmur rubs or gallops Chest: clear to auscultation bilaterally, no wheezing rales or rhonchi Abdomen: soft nontender, nondistended, normal bowel sounds, Extremities: no cyanosis, clubbing or edema noted  bilaterally Neuro: Cranial nerves II-XII intact, no focal neurological deficits   The results of significant diagnostics from this hospitalization (including imaging, microbiology, ancillary and laboratory) are listed below for reference.    LAB RESULTS: Basic Metabolic Panel:  Recent Labs Lab 07/25/13 0555  NA 141  K 3.9  CL 107  CO2 25  GLUCOSE 150*  BUN 10  CREATININE 0.69  CALCIUM 9.8   Liver  Function Tests: No results found for this basename: AST, ALT, ALKPHOS, BILITOT, PROT, ALBUMIN,  in the last 168 hours No results found for this basename: LIPASE, AMYLASE,  in the last 168 hours No results found for this basename: AMMONIA,  in the last 168 hours CBC:  Recent Labs Lab 07/25/13 0555  WBC 15.4*  HGB 14.7  HCT 41.4  MCV 93.9  PLT 355   Cardiac Enzymes: No results found for this basename: CKTOTAL, CKMB, CKMBINDEX, TROPONINI,  in the last 168 hours BNP: No components found with this basename: POCBNP,  CBG: No results found for this basename: GLUCAP,  in the last 168 hours  Significant Diagnostic Studies:  Dg Neck Soft Tissue  07/24/2013   CLINICAL DATA:  Cough, wheezing, shortness of breath, current smoker  EXAM: NECK SOFT TISSUES - 1+ VIEW  COMPARISON:  None  FINDINGS: Prevertebral soft tissues normal thickness.  Epiglottis normal thickness.  Aryepiglottic folds appear questionably minimally thickened.  Airway patent.  Osseous structures unremarkable.  No radiopaque foreign body identified.  IMPRESSION: Question mild thickening of the aryepiglottic folds.  Remainder of exam unremarkable.   Electronically Signed   By: Ulyses Southward M.D.   On: 07/24/2013 01:39   Dg Chest 2 View  07/24/2013   CLINICAL DATA:  Cough, shortness of breath and wheezing.  EXAM: CHEST  2 VIEW  COMPARISON:  None available for comparison at time of study interpretation.  FINDINGS: Cardiomediastinal silhouette is unremarkable. The lungs are clear without pleural effusions or focal consolidations. Pulmonary vasculature is unremarkable. Trachea projects midline and there is no pneumothorax. Soft tissue planes and included osseous structures are nonsuspicious. Multiple EKG lines overlie the patient and may obscure subtle underlying pathology.  IMPRESSION: No acute cardiopulmonary process.   Electronically Signed   By: Awilda Metro   On: 07/24/2013 01:41   Ct Angio Chest Pe W/cm &/or Wo Cm  07/24/2013    CLINICAL DATA:  Shortness of breath.  EXAM: CT ANGIOGRAPHY CHEST WITH CONTRAST  TECHNIQUE: Multidetector CT imaging of the chest was performed using the standard protocol during bolus administration of intravenous contrast. Multiplanar CT image reconstructions including MIPs were obtained to evaluate the vascular anatomy.  CONTRAST:  80mL OMNIPAQUE IOHEXOL 350 MG/ML SOLN  COMPARISON:  None.  FINDINGS: THORACIC INLET/BODY WALL:  No acute abnormality.  MEDIASTINUM:  Normal heart size. No pericardial effusion. No acute vascular abnormality, including pulmonary embolism. Incidental aortic ductus bump. No adenopathy.  LUNG WINDOWS:  No consolidation.  No effusion.  No suspicious pulmonary nodule.  UPPER ABDOMEN:  There is a linear filling defect within the celiac axis which is most consistent with a dissection. This section of the artery is mildly dilated to 11 mm diameter. No related flow limitation. No evidence of visceral ischemia in the upper abdomen.  OSSEOUS:  Negative.  Review of the MIP images confirms the above findings.  IMPRESSION: 1. Negative for pulmonary embolism or other acute intrathoracic abnormality. 2. 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 Is there history of connective tissue disease or remote abdominal trauma?  Electronically Signed   By: Tiburcio Pea M.D.   On: 07/24/2013 03:44       Disposition and Follow-up:     Discharge Orders   Future Orders Complete By Expires   Diet general  As directed    Discharge instructions  As directed    Comments:     Please quit smoking, you have been prescribed nicotine patches.   Increase activity slowly  As directed        DISPOSITION: Home  DIET: Regular diet  ACTIVITY: As tolerated  TESTS THAT NEED FOLLOW-UP PFTs outpatient  DISCHARGE FOLLOW-UP Follow-up Information   Schedule an appointment as soon as possible for a visit in 2 weeks to follow up.   Contact information:   Primary physician      Time  spent on Discharge: 35 minutes  Signed:   Trevar Boehringer M.D. [facility] 07/27/2013, 10:37 AM Pager: [phone]

## 2013-07-31 LAB — RSV(RESPIRATORY SYNCYTIAL VIRUS) AB, BLOOD

## 2013-08-28 ENCOUNTER — Emergency Department (HOSPITAL_BASED_OUTPATIENT_CLINIC_OR_DEPARTMENT_OTHER)
Admission: EM | Admit: 2013-08-28 | Discharge: 2013-08-28 | Disposition: A | Discharge status: Home / Self Care | Payer: Medicaid Other | Attending: Emergency Medicine | Admitting: Emergency Medicine

## 2013-08-28 ENCOUNTER — Encounter (HOSPITAL_BASED_OUTPATIENT_CLINIC_OR_DEPARTMENT_OTHER): Payer: Self-pay | Admitting: Emergency Medicine

## 2013-08-28 ENCOUNTER — Emergency Department (HOSPITAL_BASED_OUTPATIENT_CLINIC_OR_DEPARTMENT_OTHER): Payer: Medicaid Other

## 2013-08-28 DIAGNOSIS — I10 Essential (primary) hypertension: Secondary | ICD-10-CM | POA: Insufficient documentation

## 2013-08-28 DIAGNOSIS — Z79899 Other long term (current) drug therapy: Secondary | ICD-10-CM | POA: Insufficient documentation

## 2013-08-28 DIAGNOSIS — IMO0002 Reserved for concepts with insufficient information to code with codable children: Secondary | ICD-10-CM | POA: Insufficient documentation

## 2013-08-28 DIAGNOSIS — J40 Bronchitis, not specified as acute or chronic: Secondary | ICD-10-CM

## 2013-08-28 DIAGNOSIS — F17200 Nicotine dependence, unspecified, uncomplicated: Secondary | ICD-10-CM | POA: Insufficient documentation

## 2013-08-28 MED ORDER — ALBUTEROL SULFATE (5 MG/ML) 0.5% IN NEBU
5.0000 mg | INHALATION_SOLUTION | Freq: Once | RESPIRATORY_TRACT | Status: AC
  Administered 2013-08-28: 5 mg via RESPIRATORY_TRACT
  Filled 2013-08-28: qty 1

## 2013-08-28 MED ORDER — ALBUTEROL SULFATE HFA 108 (90 BASE) MCG/ACT IN AERS
2.0000 | INHALATION_SPRAY | RESPIRATORY_TRACT | Status: DC | PRN
  Administered 2013-08-28: 2 via RESPIRATORY_TRACT
  Filled 2013-08-28: qty 6.7

## 2013-08-28 MED ORDER — IPRATROPIUM BROMIDE 0.02 % IN SOLN
0.5000 mg | Freq: Once | RESPIRATORY_TRACT | Status: AC
  Administered 2013-08-28: 0.5 mg via RESPIRATORY_TRACT
  Filled 2013-08-28: qty 2.5

## 2013-08-28 NOTE — ED Notes (Signed)
Pt reports cough onset x 6 days scattered wheeze noted at this time

## 2013-08-28 NOTE — ED Notes (Signed)
Pt given inhaler for home use by RT. No new rx given.

## 2013-08-28 NOTE — ED Provider Notes (Signed)
CSN: [id number]     Arrival date & time 08/28/13  0048 History   First MD Initiated Contact with Patient 08/28/13 0200     Chief Complaint  Patient presents with  . Cough   (Consider location/radiation/quality/duration/timing/severity/associated sxs/prior Treatment) HPI This is a 37 year old female with a six-day history of cough which is nonproductive, nasal congestion, subjective fever and wheezing. She has run out of her albuterol and her wheezing worsened yesterday. She is here with moderate dyspnea and concern that she might have pneumonia. She has not had vomiting or diarrhea. She was recently diagnosed with celiac artery dissection which is asymptomatic.  Past Medical History  Diagnosis Date  . Hypertension    History reviewed. No pertinent past surgical history. History reviewed. No pertinent family history. History  Substance Use Topics  . Smoking status: Current Every Day Smoker -- 0.50 packs/day    Types: Cigarettes  . Smokeless tobacco: Never Used  . Alcohol Use: No   OB History   Grav Para Term Preterm Abortions TAB SAB Ect Mult Living                 Review of Systems  All other systems reviewed and are negative.    Allergies  Review of patient's allergies indicates no known allergies.  Home Medications   Current Outpatient Rx  Name  Route  Sig  Dispense  Refill  . albuterol (PROVENTIL HFA;VENTOLIN HFA) 108 (90 BASE) MCG/ACT inhaler   Inhalation   Inhale 2 puffs into the lungs every 6 (six) hours as needed for wheezing or shortness of breath.   1 Inhaler   5   . benzonatate (TESSALON) 100 MG capsule   Oral   Take 1 capsule (100 mg total) by mouth 3 (three) times daily as needed for cough.   30 capsule   0   . nicotine (NICODERM CQ - DOSED IN MG/24 HOURS) 21 mg/24hr patch   Transdermal   Place 1 patch (21 mg total) onto the skin daily.   28 patch   0   . predniSONE (STERAPRED UNI-PAK) 10 MG tablet   Oral   Take by mouth daily. Take as directed  on the pack   21 tablet   0    BP 154/102  Pulse 70  Temp(Src) 98.3 F (36.8 C) (Oral)  Resp 20  Ht 5\' 6"  (1.676 m)  Wt 155 lb (70.308 kg)  BMI 25.03 kg/m2  SpO2 100%  Physical Exam General: Well-developed, well-nourished female in no acute distress; appearance consistent with age of record HENT: normocephalic; atraumatic; no pharyngeal erythema or eczema Eyes: pupils equal, round and reactive to light; extraocular muscles intact Neck: supple Heart: regular rate and rhythm Lungs: Decreased air movement bilaterally with shallow breaths and expiratory wheezing Abdomen: soft; nondistended; nontender Extremities: No deformity; full range of motion Neurologic: Awake, alert and oriented; motor function intact in all extremities and symmetric; no facial droop Skin: Warm and dry Psychiatric: Normal mood and affect    ED Course  Procedures (including critical care time)    MDM  Nursing notes and vitals signs, including pulse oximetry, reviewed.  Summary of this visit's results, reviewed by myself:  Imaging Studies: Dg Chest 2 View  08/28/2013   CLINICAL DATA:  Cough.  EXAM: CHEST  2 VIEW  COMPARISON:  07/24/2013  FINDINGS: Two views of the chest demonstrate clear lungs. Heart and mediastinum are within normal limits. Trachea is midline. Bony thorax is intact.  IMPRESSION: No acute cardiopulmonary disease.  Electronically Signed   By: Richarda Overlie M.D.   On: 08/28/2013 02:34   2:41 AM Lungs clear after albuterol and Atrovent neb treatment the    Hanley Seamen, MD 08/28/13 [phone]

## 2013-12-14 ENCOUNTER — Encounter (HOSPITAL_BASED_OUTPATIENT_CLINIC_OR_DEPARTMENT_OTHER): Payer: Self-pay | Admitting: Emergency Medicine

## 2013-12-14 DIAGNOSIS — F17200 Nicotine dependence, unspecified, uncomplicated: Secondary | ICD-10-CM | POA: Insufficient documentation

## 2013-12-14 DIAGNOSIS — I10 Essential (primary) hypertension: Secondary | ICD-10-CM | POA: Insufficient documentation

## 2013-12-14 DIAGNOSIS — J45901 Unspecified asthma with (acute) exacerbation: Secondary | ICD-10-CM | POA: Insufficient documentation

## 2013-12-14 DIAGNOSIS — R0902 Hypoxemia: Secondary | ICD-10-CM | POA: Insufficient documentation

## 2013-12-14 DIAGNOSIS — Z79899 Other long term (current) drug therapy: Secondary | ICD-10-CM | POA: Insufficient documentation

## 2013-12-14 NOTE — ED Notes (Signed)
C/o shortness of breath and cough x 2 days, denies fever. Pt is out of albuterol inhaler.

## 2013-12-15 ENCOUNTER — Emergency Department (HOSPITAL_BASED_OUTPATIENT_CLINIC_OR_DEPARTMENT_OTHER)
Admission: EM | Admit: 2013-12-15 | Discharge: 2013-12-15 | Discharge status: Against Medical Advice | Payer: Medicaid Other | Attending: Emergency Medicine | Admitting: Emergency Medicine

## 2013-12-15 ENCOUNTER — Emergency Department (HOSPITAL_BASED_OUTPATIENT_CLINIC_OR_DEPARTMENT_OTHER): Payer: Medicaid Other

## 2013-12-15 DIAGNOSIS — R0902 Hypoxemia: Secondary | ICD-10-CM

## 2013-12-15 DIAGNOSIS — J45901 Unspecified asthma with (acute) exacerbation: Secondary | ICD-10-CM

## 2013-12-15 DIAGNOSIS — R0602 Shortness of breath: Secondary | ICD-10-CM

## 2013-12-15 MED ORDER — ALBUTEROL SULFATE (2.5 MG/3ML) 0.083% IN NEBU
5.0000 mg | INHALATION_SOLUTION | Freq: Once | RESPIRATORY_TRACT | Status: AC
  Administered 2013-12-15: 5 mg via RESPIRATORY_TRACT
  Filled 2013-12-15: qty 6

## 2013-12-15 MED ORDER — ALBUTEROL SULFATE (2.5 MG/3ML) 0.083% IN NEBU
2.5000 mg | INHALATION_SOLUTION | Freq: Once | RESPIRATORY_TRACT | Status: AC
  Administered 2013-12-15: 2.5 mg via RESPIRATORY_TRACT

## 2013-12-15 MED ORDER — IPRATROPIUM-ALBUTEROL 0.5-2.5 (3) MG/3ML IN SOLN
RESPIRATORY_TRACT | Status: AC
  Filled 2013-12-15: qty 3

## 2013-12-15 MED ORDER — PREDNISONE 10 MG PO TABS
60.0000 mg | ORAL_TABLET | Freq: Once | ORAL | Status: AC
  Administered 2013-12-15: 60 mg via ORAL
  Filled 2013-12-15 (×2): qty 1

## 2013-12-15 MED ORDER — ALBUTEROL SULFATE HFA 108 (90 BASE) MCG/ACT IN AERS
1.0000 | INHALATION_SPRAY | RESPIRATORY_TRACT | Status: DC
  Administered 2013-12-15: 2 via RESPIRATORY_TRACT
  Filled 2013-12-15: qty 6.7

## 2013-12-15 MED ORDER — ALBUTEROL SULFATE (2.5 MG/3ML) 0.083% IN NEBU
INHALATION_SOLUTION | RESPIRATORY_TRACT | Status: AC
  Filled 2013-12-15: qty 3

## 2013-12-15 MED ORDER — PREDNISONE 20 MG PO TABS: 60.0000 mg | ORAL_TABLET | Freq: Every day | ORAL | Status: DC

## 2013-12-15 MED ORDER — IPRATROPIUM-ALBUTEROL 0.5-2.5 (3) MG/3ML IN SOLN
3.0000 mL | Freq: Once | RESPIRATORY_TRACT | Status: AC
  Administered 2013-12-15: 3 mL via RESPIRATORY_TRACT

## 2013-12-15 MED ORDER — ALBUTEROL SULFATE (2.5 MG/3ML) 0.083% IN NEBU
15.0000 mg | INHALATION_SOLUTION | RESPIRATORY_TRACT | Status: DC
  Administered 2013-12-15: 15 mg via RESPIRATORY_TRACT
  Filled 2013-12-15: qty 18

## 2013-12-15 MED ORDER — ACETAMINOPHEN 325 MG PO TABS
650.0000 mg | ORAL_TABLET | Freq: Once | ORAL | Status: AC
  Administered 2013-12-15: 650 mg via ORAL
  Filled 2013-12-15: qty 2

## 2013-12-15 NOTE — ED Provider Notes (Signed)
CSN: [id number]     Arrival date & time 12/14/13  2314 History   First MD Initiated Contact with Patient 12/15/13 0134     Chief Complaint  Patient presents with  . Shortness of Breath     (Consider location/radiation/quality/duration/timing/severity/associated sxs/prior Treatment) HPI 38 year old female presents to emergency room from home with complaint of shortness of breath, cough, Raynaud's, for last 2 days.  Patient with similar episode in November, requiring admission to the hospital to 2 hypoxia.  Patient reports she has not yet been in to followup with primary care Dr., has an appointment with her doctor next Thursday.  Patient reports she is gone from smoking a pack a day to 5 cigarettes a day.  She denies any fever or chills.  No sick contacts. Past Medical History  Diagnosis Date  . Hypertension    History reviewed. No pertinent past surgical history. History reviewed. No pertinent family history. History  Substance Use Topics  . Smoking status: Current Every Day Smoker -- 0.50 packs/day    Types: Cigarettes  . Smokeless tobacco: Never Used  . Alcohol Use: No   OB History   Grav Para Term Preterm Abortions TAB SAB Ect Mult Living                 Review of Systems  See History of Present Illness; otherwise all other systems are reviewed and negative   Allergies  Review of patient's allergies indicates no known allergies.  Home Medications   Current Outpatient Rx  Name  Route  Sig  Dispense  Refill  . albuterol (PROVENTIL HFA;VENTOLIN HFA) 108 (90 BASE) MCG/ACT inhaler   Inhalation   Inhale 2 puffs into the lungs every 6 (six) hours as needed for wheezing or shortness of breath.   1 Inhaler   5   . benzonatate (TESSALON) 100 MG capsule   Oral   Take 1 capsule (100 mg total) by mouth 3 (three) times daily as needed for cough.   30 capsule   0   . nicotine (NICODERM CQ - DOSED IN MG/24 HOURS) 21 mg/24hr patch   Transdermal   Place 1 patch (21 mg total)  onto the skin daily.   28 patch   0   . predniSONE (DELTASONE) 20 MG tablet   Oral   Take 3 tablets (60 mg total) by mouth daily.   15 tablet   0   . predniSONE (STERAPRED UNI-PAK) 10 MG tablet   Oral   Take by mouth daily. Take as directed on the pack   21 tablet   0    BP 164/94  Pulse 69  Temp(Src) 98.9 F (37.2 C) (Oral)  Resp 20  Ht 5\' 6"  (1.676 m)  Wt 160 lb (72.576 kg)  BMI 25.84 kg/m2  SpO2 93% Physical Exam  Nursing note and vitals reviewed. Constitutional: She is oriented to person, place, and time. She appears well-developed and well-nourished.  Patient is tremulous, has had 2 neb treatments  HENT:  Head: Normocephalic and atraumatic.  Right Ear: External ear normal.  Left Ear: External ear normal.  Nose: Nose normal.  Mouth/Throat: Oropharynx is clear and moist.  Eyes: Conjunctivae and EOM are normal. Pupils are equal, round, and reactive to light.  Neck: Normal range of motion. Neck supple. No JVD present. No tracheal deviation present. No thyromegaly present.  Cardiovascular: Normal rate, regular rhythm, normal heart sounds and intact distal pulses.  Exam reveals no gallop and no friction rub.   No  murmur heard. Pulmonary/Chest: Effort normal. No stridor. No respiratory distress. She has wheezes. She has no rales. She exhibits no tenderness.  Abdominal: Soft. Bowel sounds are normal. She exhibits no distension and no mass. There is no tenderness. There is no rebound and no guarding.  Musculoskeletal: Normal range of motion. She exhibits no edema and no tenderness.  Lymphadenopathy:    She has no cervical adenopathy.  Neurological: She is alert and oriented to person, place, and time. She exhibits normal muscle tone. Coordination normal.  Skin: Skin is warm and dry. No rash noted. No erythema. No pallor.  Psychiatric: She has a normal mood and affect. Her behavior is normal. Judgment and thought content normal.    ED Course  Procedures (including  critical care time) Labs Review Labs Reviewed - No data to display Imaging Review Dg Chest 2 View  12/15/2013   CLINICAL DATA:  Shortness of breath.  EXAM: CHEST  2 VIEW  COMPARISON:  Chest x-ray 08/28/2013.  FINDINGS: Lung volumes are normal. No consolidative airspace disease. No pleural effusions. No pneumothorax. No pulmonary nodule or mass noted. Pulmonary vasculature and the cardiomediastinal silhouette are within normal limits.  IMPRESSION: 1.  No radiographic evidence of acute cardiopulmonary disease.   Electronically Signed   By: Trudie Reed M.D.   On: 12/15/2013 02:05     EKG Interpretation None      MDM   Final diagnoses:  SOB (shortness of breath)  Asthma exacerbation  Hypoxia    38 year old female with wheezing and cough for 2 days.  After 2 albuterol Atrovent nebs, she is continuing to have end expiratory wheezing, and low oxygen saturations.  Plan for 15 mg continuous neb.  Over an hour.   Patient has received an hour long neb.  Her wheezing has resolved, but she is still hypoxic with saturations getting as low as 88.  Discussed with patient about her hypoxia, and the need for admission in the hospital.  Patient reports she does not have childcare for her 1-year-old daughter, and does not feel comfortable leaving her with anyone.  She does not feel that her hospitalization in November for 5 days did anything for her.  I tried to convince her to be admitted to the hospital, but she refuses.  Patient is to leave against medical device.  We will give her an albuterol inhaler here, as well as a prescription for prednisone burst.  She has been counseled that leaving would be detrimental to her health and, possibly might lead to death.  She has been cautioned on symptoms to watch for that may indicate a her health is worsening.  She was instructed to immediately to a hospital.  Should she have worsening symptoms and not to return to med.  Center [facility], as she would have a  delay in care with transfer to admitting hospital.    Olivia Mackie, MD 12/15/13 [phone]

## 2013-12-15 NOTE — Discharge Instructions (Signed)
You have been strongly encouraged to be admitted to the hospital for your ongoing low oxygen levels and wheezing.  If you are able to make childcare arrangements, please go to the nearest hospital for admission.  Your workup will be more easily accessed if you go to a hospital in the [facility], such as [facility] or [facility].  Take medications as indicated.  Return at any time.   Asthma, Acute Bronchospasm Acute bronchospasm caused by asthma is also referred to as an asthma attack. Bronchospasm means your air passages become narrowed. The narrowing is caused by inflammation and tightening of the muscles in the air tubes (bronchi) in your lungs. This can make it hard to breath or cause you to wheeze and cough. CAUSES Possible triggers are:  Animal dander from the skin, hair, or feathers of animals.  Dust mites contained in house dust.  Cockroaches.  Pollen from trees or grass.  Mold.  Cigarette or tobacco smoke.  Air pollutants such as dust, household cleaners, hair sprays, aerosol sprays, paint fumes, strong chemicals, or strong odors.  Cold air or weather changes. Cold air may trigger inflammation. Winds increase molds and pollens in the air.  Strong emotions such as crying or laughing hard.  Stress.  Certain medicines such as aspirin or beta-blockers.  Sulfites in foods and drinks, such as dried fruits and wine.  Infections or inflammatory conditions, such as a flu, cold, or inflammation of the nasal membranes (rhinitis).  Gastroesophageal reflux disease (GERD). GERD is a condition where stomach acid backs up into your throat (esophagus).  Exercise or strenuous activity. SIGNS AND SYMPTOMS   Wheezing.  Excessive coughing, particularly at night.  Chest tightness.  Shortness of breath. DIAGNOSIS  Your health care provider will ask you about your medical history and perform a physical exam. A chest X-ray or blood testing may be performed to look for  other causes of your symptoms or other conditions that may have triggered your asthma attack. TREATMENT  Treatment is aimed at reducing inflammation and opening up the airways in your lungs. Most asthma attacks are treated with inhaled medicines. These include quick relief or rescue medicines (such as bronchodilators) and controller medicines (such as inhaled corticosteroids). These medicines are sometimes given through an inhaler or a nebulizer. Systemic steroid medicine taken by mouth or given through an IV tube also can be used to reduce the inflammation when an attack is moderate or severe. Antibiotic medicines are only used if a bacterial infection is present.  HOME CARE INSTRUCTIONS   Rest.  Drink plenty of liquids. This helps the mucus to remain thin and be easily coughed up. Only use caffeine in moderation and do not use alcohol until you have recovered from your illness.  Do not smoke. Avoid being exposed to secondhand smoke.  You play a critical role in keeping yourself in good health. Avoid exposure to things that cause you to wheeze or to have breathing problems.  Keep your medicines up to date and available. Carefully follow your health care provider's treatment plan.  Take your medicine exactly as prescribed.  When pollen or pollution is bad, keep windows closed and use an air conditioner or go to places with air conditioning.  Asthma requires careful medical care. See your health care provider for a follow-up as advised. If you are more than [redacted] weeks pregnant and you were prescribed any new medicines, let your obstetrician know about the visit and how you are doing. Follow-up with  your health care provider as directed.  After you have recovered from your asthma attack, make an appointment with your outpatient doctor to talk about ways to reduce the likelihood of future attacks. If you do not have a doctor who manages your asthma, make an appointment with a primary care doctor to  discuss your asthma. SEEK IMMEDIATE MEDICAL CARE IF:   You are getting worse.  You have trouble breathing. If severe, call your local emergency services (911 in the [place]  You develop chest pain or discomfort.  You are vomiting.  You are not able to keep fluids down.  You are coughing up yellow, green, brown, or bloody sputum.  You have a fever and your symptoms suddenly get worse.  You have trouble swallowing. MAKE SURE YOU:   Understand these instructions.  Will watch your condition.  Will get help right away if you are not doing well or get worse. Document Released: 12/24/2006 Document Revised: 05/11/2013 Document Reviewed: 03/16/2013 [facility] Patient Information 2014 [place], [place].  Hypoxemia Hypoxemia occurs when your blood does not contain enough oxygen. The body cannot work well when it does not have enough oxygen, because every part of your body needs oxygen. Oxygen travels to all parts of the body through your blood. Hypoxemia can develop suddenly or can come on slowly. CAUSES Some common causes of hypoxemia include:  Long-term (chronic) lung diseases, such as chronic obstructive pulmonary disease (COPD) or interstitial lung disease.  Disorders that affect breathing at night, such as sleep apnea.  Fluid buildup in your lungs (pulmonary edema).  Lung infection (pneumonia).  Lung or throat cancer.  Abnormal blood flow that bypasses the lungs (shunt).  Certain diseasesthat affect nerves or muscles.  A collapsed lung (pneumothorax).  A blood clot in the lungs (pulmonary embolus).  Certain types of heart disease.  Slow or shallow breathing (hypoventilation).  Certain medicines.  High altitudes.  Toxic chemicals and gases. SIGNS AND SYMPTOMS Not everyone who has hypoxemia will develop symptoms. If the hypoxemia developed quickly, you will likely have symptoms such as shortness of breath. If the hypoxemia came on slowly over months or years, you  may not notice any symptoms. Symptoms can include:  Shortness of breath (dyspnea).  Bluish color of the skin, lips, or nail beds.  Breathing that is fast, noisy, or shallow.  A fast heartbeat.  Feeling tired or sleepy.  Being confused or feeling anxious. DIAGNOSIS To determine if you have hypoxemia, your health care provider may perform:  A physical exam.  Blood tests.  A pulse oximetry. A sensor will be put on your finger, toe, or earlobe to measure the percent of oxygen in your blood. TREATMENT You will likely be treated with oxygen therapy. Depending on the cause of your hypoxemia, you may need oxygen for a short time (weeks or months), or you may need it indefinitely. Your health care provider may also recommend other therapies to treat the underlying cause of your hypoxemia. HOME CARE INSTRUCTIONS  Only take over-the-counter or prescription medicines as directed by your health care provider.  Follow oxygen safety measures if you are on oxygen therapy. These may include:  Always having a backup supply of oxygen.  Not allowing anyone to smoke around oxygen.  Handling the oxygen tanks carefully and as instructed.  If you smoke, quit. Stay away from people who smoke.  Follow up with your health care provider as directed. SEEK MEDICAL CARE IF:  You have any concerns about your oxygen therapy.  You  still have trouble breathing.  You become short of breath when you exercise.  You are tired when you wake up.  You have a headache when you wake up. SEEK IMMEDIATE MEDICAL CARE IF:   Your breathing gets worse.  You have new shortness of breath with normal activity.  You have a bluish color of the skin, lips, or nail beds.  You have confusion or cloudy thinking.  You cough up dark mucus.  You have chest pain.  You have a fever. MAKE SURE YOU:  Understand these instructions.  Will watch your condition.  Will get help right away if you are not doing well or  get worse. Document Released: 03/24/2011 Document Revised: 05/11/2013 Document Reviewed: 04/07/2013 [facility] Patient Information 2014 [place].

## 2013-12-15 NOTE — ED Notes (Signed)
Patient CAT stopped for 30minutes per Dr. Norlene Campbelltter for shakiness. Patient saturations maintained at 93%, patient not in distress. Will continue to monitor and will continue CAT in 30 minutes.

## 2013-12-15 NOTE — ED Notes (Signed)
C/o sob x 2 days  Congestion, runny nose

## 2013-12-15 NOTE — Patient Instructions (Signed)
Instructed patient on the proper use of administering albuterol mdi via aerochamber patient tolerated well 

## 2014-05-07 ENCOUNTER — Encounter (HOSPITAL_BASED_OUTPATIENT_CLINIC_OR_DEPARTMENT_OTHER): Payer: Self-pay | Admitting: Emergency Medicine

## 2014-05-07 ENCOUNTER — Inpatient Hospital Stay (HOSPITAL_BASED_OUTPATIENT_CLINIC_OR_DEPARTMENT_OTHER)
Admission: EM | Admit: 2014-05-07 | Discharge: 2014-05-07 | DRG: 204 | Discharge status: Against Medical Advice | Payer: Medicaid Other | Attending: Internal Medicine | Admitting: Internal Medicine

## 2014-05-07 ENCOUNTER — Emergency Department (HOSPITAL_BASED_OUTPATIENT_CLINIC_OR_DEPARTMENT_OTHER): Payer: Medicaid Other

## 2014-05-07 DIAGNOSIS — R062 Wheezing: Secondary | ICD-10-CM | POA: Diagnosis present

## 2014-05-07 DIAGNOSIS — R059 Cough, unspecified: Secondary | ICD-10-CM | POA: Diagnosis present

## 2014-05-07 DIAGNOSIS — F17200 Nicotine dependence, unspecified, uncomplicated: Secondary | ICD-10-CM | POA: Diagnosis present

## 2014-05-07 DIAGNOSIS — R05 Cough: Secondary | ICD-10-CM | POA: Diagnosis present

## 2014-05-07 DIAGNOSIS — R0602 Shortness of breath: Principal | ICD-10-CM | POA: Diagnosis present

## 2014-05-07 DIAGNOSIS — J449 Chronic obstructive pulmonary disease, unspecified: Secondary | ICD-10-CM | POA: Diagnosis present

## 2014-05-07 DIAGNOSIS — J441 Chronic obstructive pulmonary disease with (acute) exacerbation: Secondary | ICD-10-CM

## 2014-05-07 DIAGNOSIS — I10 Essential (primary) hypertension: Secondary | ICD-10-CM | POA: Diagnosis present

## 2014-05-07 DIAGNOSIS — Z96659 Presence of unspecified artificial knee joint: Secondary | ICD-10-CM | POA: Diagnosis not present

## 2014-05-07 LAB — CBC WITH DIFFERENTIAL/PLATELET
Basophils Absolute: 0 10*3/uL (ref 0.0–0.1)
Basophils Relative: 0 % (ref 0–1)
Eosinophils Absolute: 0.7 10*3/uL (ref 0.0–0.7)
Eosinophils Relative: 5 % (ref 0–5)
HEMATOCRIT: 44.4 % (ref 36.0–46.0)
HEMOGLOBIN: 15.1 g/dL — AB (ref 12.0–15.0)
LYMPHS ABS: 5.1 10*3/uL — AB (ref 0.7–4.0)
Lymphocytes Relative: 34 % (ref 12–46)
MCH: 32.3 pg (ref 26.0–34.0)
MCHC: 34 g/dL (ref 30.0–36.0)
MCV: 94.9 fL (ref 78.0–100.0)
MONOS PCT: 7 % (ref 3–12)
Monocytes Absolute: 1 10*3/uL (ref 0.1–1.0)
NEUTROS ABS: 8.2 10*3/uL — AB (ref 1.7–7.7)
Neutrophils Relative %: 54 % (ref 43–77)
Platelets: 361 10*3/uL (ref 150–400)
RBC: 4.68 MIL/uL (ref 3.87–5.11)
RDW: 13.5 % (ref 11.5–15.5)
WBC: 15.1 10*3/uL — ABNORMAL HIGH (ref 4.0–10.5)

## 2014-05-07 LAB — I-STAT VENOUS BLOOD GAS, ED
ACID-BASE EXCESS: 1 mmol/L (ref 0.0–2.0)
Bicarbonate: 28.9 mEq/L — ABNORMAL HIGH (ref 20.0–24.0)
O2 SAT: 30 %
PO2 VEN: 22 mmHg — AB (ref 30.0–45.0)
Patient temperature: 98.7
TCO2: 31 mmol/L (ref 0–100)
pCO2, Ven: 57.6 mmHg — ABNORMAL HIGH (ref 45.0–50.0)
pH, Ven: 7.308 — ABNORMAL HIGH (ref 7.250–7.300)

## 2014-05-07 LAB — BASIC METABOLIC PANEL
Anion gap: 13 (ref 5–15)
BUN: 11 mg/dL (ref 6–23)
CHLORIDE: 102 meq/L (ref 96–112)
CO2: 26 mEq/L (ref 19–32)
Calcium: 9.8 mg/dL (ref 8.4–10.5)
Creatinine, Ser: 0.9 mg/dL (ref 0.50–1.10)
GFR calc Af Amer: >90 mL/min (ref >90)
GFR calc non Af Amer: 81 mL/min — ABNORMAL LOW (ref >90)
GLUCOSE: 177 mg/dL — AB (ref 70–99)
POTASSIUM: 4.1 meq/L (ref 3.7–5.3)
Sodium: 141 mEq/L (ref 137–147)

## 2014-05-07 MED ORDER — DEXTROSE 5 % IV SOLN
INTRAVENOUS | Status: AC
  Administered 2014-05-07: 500 mg via INTRAVENOUS
  Filled 2014-05-07: qty 500

## 2014-05-07 MED ORDER — DEXTROSE 5 % IV SOLN
500.0000 mg | Freq: Once | INTRAVENOUS | Status: AC
  Administered 2014-05-07: 500 mg via INTRAVENOUS
  Filled 2014-05-07: qty 500

## 2014-05-07 MED ORDER — ALBUTEROL SULFATE HFA 108 (90 BASE) MCG/ACT IN AERS
INHALATION_SPRAY | RESPIRATORY_TRACT | Status: AC
  Filled 2014-05-07: qty 6.7

## 2014-05-07 MED ORDER — ALBUTEROL (5 MG/ML) CONTINUOUS INHALATION SOLN
10.0000 mg/h | INHALATION_SOLUTION | RESPIRATORY_TRACT | Status: AC
  Administered 2014-05-07: 10 mg/h via RESPIRATORY_TRACT
  Filled 2014-05-07: qty 20

## 2014-05-07 MED ORDER — MAGNESIUM SULFATE 50 % IJ SOLN
INTRAMUSCULAR | Status: AC
  Administered 2014-05-07: 1 g
  Filled 2014-05-07: qty 2

## 2014-05-07 MED ORDER — METHYLPREDNISOLONE SODIUM SUCC 125 MG IJ SOLR
125.0000 mg | Freq: Once | INTRAMUSCULAR | Status: AC
  Administered 2014-05-07: 125 mg via INTRAVENOUS
  Filled 2014-05-07: qty 2

## 2014-05-07 MED ORDER — ALBUTEROL SULFATE (2.5 MG/3ML) 0.083% IN NEBU
10.0000 mg | INHALATION_SOLUTION | Freq: Once | RESPIRATORY_TRACT | Status: AC
  Administered 2014-05-07: 10 mg via RESPIRATORY_TRACT
  Filled 2014-05-07: qty 12

## 2014-05-07 MED ORDER — ALBUTEROL SULFATE (2.5 MG/3ML) 0.083% IN NEBU
INHALATION_SOLUTION | RESPIRATORY_TRACT | Status: AC
  Filled 2014-05-07: qty 6

## 2014-05-07 MED ORDER — IPRATROPIUM BROMIDE 0.02 % IN SOLN
RESPIRATORY_TRACT | Status: AC
  Administered 2014-05-07: 0.5 mg
  Filled 2014-05-07: qty 2.5

## 2014-05-07 MED ORDER — IPRATROPIUM BROMIDE 0.02 % IN SOLN: 0.5000 mg | Freq: Once | RESPIRATORY_TRACT | Status: DC

## 2014-05-07 MED ORDER — MAGNESIUM SULFATE 50 % IJ SOLN: 1.0000 g | Freq: Once | INTRAVENOUS | Status: AC

## 2014-05-07 MED ORDER — CEFTRIAXONE SODIUM 1 G IJ SOLR
1.0000 g | Freq: Once | INTRAMUSCULAR | Status: AC
  Administered 2014-05-07: 1 g via INTRAVENOUS

## 2014-05-07 MED ORDER — ALBUTEROL SULFATE (2.5 MG/3ML) 0.083% IN NEBU
INHALATION_SOLUTION | RESPIRATORY_TRACT | Status: AC
  Administered 2014-05-07: 5 mg
  Filled 2014-05-07: qty 6

## 2014-05-07 MED ORDER — CEFTRIAXONE SODIUM 1 G IJ SOLR
INTRAMUSCULAR | Status: AC
  Administered 2014-05-07: 1000 mg
  Filled 2014-05-07: qty 10

## 2014-05-07 MED ORDER — ALBUTEROL SULFATE (2.5 MG/3ML) 0.083% IN NEBU: 10.0000 mg | INHALATION_SOLUTION | Freq: Once | RESPIRATORY_TRACT | Status: AC

## 2014-05-07 MED ORDER — ALBUTEROL SULFATE (2.5 MG/3ML) 0.083% IN NEBU: 5.0000 mg | INHALATION_SOLUTION | Freq: Once | RESPIRATORY_TRACT | Status: DC

## 2014-05-07 MED ORDER — ALBUTEROL SULFATE HFA 108 (90 BASE) MCG/ACT IN AERS
2.0000 | INHALATION_SPRAY | RESPIRATORY_TRACT | Status: DC | PRN
  Administered 2014-05-07: 2 via RESPIRATORY_TRACT

## 2014-05-07 NOTE — ED Notes (Signed)
First initial pulse oximetry was 80% on room air.

## 2014-05-07 NOTE — ED Notes (Signed)
Spoke with pt about the risk of leaving AMA. Pt still saying she cant stay. Will continue breathing tx at this time.

## 2014-05-07 NOTE — ED Notes (Signed)
Placed on nasal canula at 2 ltrs. 02, resulted in patient stats to 90%.

## 2014-05-07 NOTE — ED Notes (Signed)
After ambulating to BR HR increased to 121, WOB increased and SpO2 92%.  Placed back on 10mg  Albuterol continuous.

## 2014-05-07 NOTE — ED Notes (Signed)
Spoke with patient who understands the importance of admission into the hospital. Will attempt to speak with her SO regarding child care and possible options for their 38 year old.

## 2014-05-07 NOTE — ED Notes (Addendum)
C/o sob, wheezing & productive cough. (denies: pain, fever, nvd or other sx), sx onset yesterday, gradually progressively worse. Denies h/o asthma. Reports h/o similar sx, seen in system in march for the same, no meds PTA, relates sx to increased amount of smoking. Pt orthopneic, sob, DOE increased wob. Back to room in w/c[facility]

## 2014-05-07 NOTE — ED Provider Notes (Signed)
With [facility] en route to take patient to an ICU overflow bed patient has decided to leave against medical advice.    At the time of the deciding to leave AMA the patient is saturating 100% on O2 and therefore has decision making capacity to refuse care.   With [name] ClicheBobby, nurse and [name] [name] of RT present EDP explained the risk of leaving AMA.  The risks are but are not limited to: Death, respiratory arrest, cardiac arrest, flash pulmonary edema, anoxic brain injury, accident, and prolonged morbidity.  She is welcomed to return at any time.    [name] Grabel Smitty CordsK Karletta Millay-Rasch, MD 05/07/14 0730

## 2014-05-07 NOTE — ED Notes (Signed)
CAT neb in progress. Switched from O2 to medical air.

## 2014-05-07 NOTE — ED Notes (Signed)
Pt falling asleep sitting up while reading a book. 91% on 2L O2 [place].

## 2014-05-07 NOTE — ED Notes (Signed)
Dr. Nicanor AlconPalumbo in to speak with pt about leaving AMA d/t family issues.

## 2014-05-07 NOTE — ED Provider Notes (Signed)
CSN: [id number]     Arrival date & time 05/07/14  [id number] History   First MD Initiated Contact with Patient 05/07/14 [phone]     Chief Complaint  Patient presents with  . Shortness of Breath  . Wheezing  . Cough     (Consider location/radiation/quality/duration/timing/severity/associated sxs/prior Treatment) Patient is a 38 y.o. female presenting with wheezing. The history is limited by the condition of the patient.  Wheezing Severity:  Severe Severity compared to prior episodes:  Similar Onset quality:  Gradual Timing:  Constant Progression:  Worsening Chronicity:  Recurrent Context: smoke exposure   Context comment:  Smoking more than usual  Relieved by:  Nothing Worsened by:  Nothing tried Ineffective treatments:  None tried Associated symptoms: cough   Associated symptoms: no chest pain and no fever   Risk factors: no suspected foreign body     Past Medical History  Diagnosis Date  . Hypertension    History reviewed. No pertinent past surgical history. No family history on file. History  Substance Use Topics  . Smoking status: Current Every Day Smoker -- 0.50 packs/day    Types: Cigarettes  . Smokeless tobacco: Never Used  . Alcohol Use: No   OB History   Grav Para Term Preterm Abortions TAB SAB Ect Mult Living                 Review of Systems  Constitutional: Negative for fever.  Respiratory: Positive for cough and wheezing.   Cardiovascular: Negative for chest pain.  All other systems reviewed and are negative.     Allergies  Review of patient's allergies indicates no known allergies.  Home Medications   Prior to Admission medications   Medication Sig Start Date End Date Taking? Authorizing Provider  albuterol (PROVENTIL HFA;VENTOLIN HFA) 108 (90 BASE) MCG/ACT inhaler Inhale 2 puffs into the lungs every 6 (six) hours as needed for wheezing or shortness of breath. 07/27/13   Ripudeep Jenna LuoK Rai, MD  benzonatate (TESSALON) 100 MG capsule Take 1 capsule (100  mg total) by mouth 3 (three) times daily as needed for cough. 07/27/13   Ripudeep Jenna LuoK Rai, MD  nicotine (NICODERM CQ - DOSED IN MG/24 HOURS) 21 mg/24hr patch Place 1 patch (21 mg total) onto the skin daily. 07/27/13   Ripudeep Jenna LuoK Rai, MD  predniSONE (DELTASONE) 20 MG tablet Take 3 tablets (60 mg total) by mouth daily. 12/15/13   Olivia Mackielga M Otter, MD  predniSONE (STERAPRED UNI-PAK) 10 MG tablet Take by mouth daily. Take as directed on the pack 07/27/13   Ripudeep K Rai, MD   BP 159/113  Pulse 90  Temp(Src) 98.9 F (37.2 C) (Oral)  Resp 24  Ht 5\' 6"  (1.676 m)  Wt 165 lb 9.6 oz (75.116 kg)  BMI 26.74 kg/m2  SpO2 91%  LMP 04/24/2014 Physical Exam  Constitutional: She is oriented to person, place, and time. She appears well-developed and well-nourished.  HENT:  Head: Normocephalic and atraumatic.  Eyes: Conjunctivae are normal. Pupils are equal, round, and reactive to light.  Neck: Normal range of motion. Neck supple.  Cardiovascular: Normal rate, regular rhythm and intact distal pulses.   Pulmonary/Chest: No stridor. She has wheezes. She has no rales.  Abdominal: Soft. Bowel sounds are normal. There is no tenderness. There is no rebound and no guarding.  Musculoskeletal: Normal range of motion. She exhibits no edema.  Neurological: She is alert and oriented to person, place, and time.  Skin: Skin is warm and dry. She is not diaphoretic.  Psychiatric: She has a normal mood and affect.    ED Course  Procedures (including critical care time) Labs Review Labs Reviewed  CBC WITH DIFFERENTIAL - Abnormal; Notable for the following:    WBC 15.1 (*)    Hemoglobin 15.1 (*)    Neutro Abs 8.2 (*)    Lymphs Abs 5.1 (*)    All other components within normal limits  BASIC METABOLIC PANEL    Imaging Review No results found.   EKG Interpretation None      MDM   Final diagnoses:  None    MDM Reviewed: previous chart, nursing note and vitals Reviewed previous: labs, x-ray and CT  scan Interpretation: labs and x-ray    Medications  azithromycin (ZITHROMAX) 500 mg in dextrose 5 % 250 mL IVPB (500 mg Intravenous New Bag/Given 05/07/14 0654)  albuterol (PROVENTIL) (2.5 MG/3ML) 0.083% nebulizer solution (5 mg  Given 05/07/14 0348)  ipratropium (ATROVENT) 0.02 % nebulizer solution (0.5 mg  Given 05/07/14 0348)  methylPREDNISolone sodium succinate (SOLU-MEDROL) 125 mg/2 mL injection 125 mg (125 mg Intravenous Given 05/07/14 0356)  albuterol (PROVENTIL) (2.5 MG/3ML) 0.083% nebulizer solution 10 mg (10 mg Nebulization Given 05/07/14 0436)  albuterol (PROVENTIL) (2.5 MG/3ML) 0.083% nebulizer solution (  Duplicate 05/07/14 0436)  cefTRIAXone (ROCEPHIN) 1 g in dextrose 5 % 50 mL IVPB (0 g Intravenous Stopped 05/07/14 0637)  cefTRIAXone (ROCEPHIN) 1 G injection (1,000 mg  Given 05/07/14 0604)  magnesium sulfate 1 g in dextrose 5 % 100 mL IVPB (0 g Intravenous Stopped 05/07/14 0654)  albuterol (PROVENTIL) (2.5 MG/3ML) 0.083% nebulizer solution 10 mg (0 mg Nebulization Duplicate 05/07/14 0436)  magnesium sulfate 50 % (IV Push/IM) injection (1 g  Given 05/07/14 0636)   CRITICAL CARE Performed by: Jasmine Awe Total critical care time: 31 minutes  Critical care time was exclusive of separately billable procedures and treating other patients. Critical care was necessary to treat or prevent imminent or life-threatening deterioration. Critical care was time spent personally by me on the following activities: development of treatment plan with patient and/or surrogate as well as nursing, discussions with consultants, evaluation of patient's response to treatment, examination of patient, obtaining history from patient or surrogate, ordering and performing treatments and interventions, ordering and review of laboratory studies, ordering and review of radiographic studies, pulse oximetry and re-evaluation of patient's condition.   Jasmine Awe, MD 05/07/14 [phone]

## 2014-11-27 ENCOUNTER — Encounter (HOSPITAL_BASED_OUTPATIENT_CLINIC_OR_DEPARTMENT_OTHER): Payer: Self-pay | Admitting: *Deleted

## 2014-11-27 ENCOUNTER — Emergency Department (HOSPITAL_BASED_OUTPATIENT_CLINIC_OR_DEPARTMENT_OTHER)
Admission: EM | Admit: 2014-11-27 | Discharge: 2014-11-27 | Disposition: A | Discharge status: Home / Self Care | Payer: Medicaid Other | Attending: Emergency Medicine | Admitting: Emergency Medicine

## 2014-11-27 ENCOUNTER — Emergency Department (HOSPITAL_BASED_OUTPATIENT_CLINIC_OR_DEPARTMENT_OTHER): Payer: Medicaid Other

## 2014-11-27 DIAGNOSIS — J441 Chronic obstructive pulmonary disease with (acute) exacerbation: Secondary | ICD-10-CM | POA: Diagnosis not present

## 2014-11-27 DIAGNOSIS — Z79899 Other long term (current) drug therapy: Secondary | ICD-10-CM | POA: Insufficient documentation

## 2014-11-27 DIAGNOSIS — Z7952 Long term (current) use of systemic steroids: Secondary | ICD-10-CM | POA: Insufficient documentation

## 2014-11-27 DIAGNOSIS — Z720 Tobacco use: Secondary | ICD-10-CM | POA: Diagnosis not present

## 2014-11-27 DIAGNOSIS — R0602 Shortness of breath: Secondary | ICD-10-CM | POA: Diagnosis present

## 2014-11-27 DIAGNOSIS — I10 Essential (primary) hypertension: Secondary | ICD-10-CM | POA: Diagnosis not present

## 2014-11-27 HISTORY — DX: Unspecified asthma, uncomplicated: J45.909

## 2014-11-27 LAB — BASIC METABOLIC PANEL
Anion gap: 1 — ABNORMAL LOW (ref 5–15)
BUN: 9 mg/dL (ref 6–23)
CHLORIDE: 110 mmol/L (ref 96–112)
CO2: 24 mmol/L (ref 19–32)
Calcium: 9.1 mg/dL (ref 8.4–10.5)
Creatinine, Ser: 0.77 mg/dL (ref 0.50–1.10)
Glucose, Bld: 129 mg/dL — ABNORMAL HIGH (ref 70–99)
Potassium: 4.1 mmol/L (ref 3.5–5.1)
Sodium: 135 mmol/L (ref 135–145)

## 2014-11-27 LAB — CBC WITH DIFFERENTIAL/PLATELET
BASOS PCT: 0 % (ref 0–1)
Basophils Absolute: 0 10*3/uL (ref 0.0–0.1)
Eosinophils Absolute: 2.2 10*3/uL — ABNORMAL HIGH (ref 0.0–0.7)
Eosinophils Relative: 16 % — ABNORMAL HIGH (ref 0–5)
HCT: 46.5 % — ABNORMAL HIGH (ref 36.0–46.0)
Hemoglobin: 15.7 g/dL — ABNORMAL HIGH (ref 12.0–15.0)
Lymphocytes Relative: 32 % (ref 12–46)
Lymphs Abs: 4.3 10*3/uL — ABNORMAL HIGH (ref 0.7–4.0)
MCH: 31.8 pg (ref 26.0–34.0)
MCHC: 33.8 g/dL (ref 30.0–36.0)
MCV: 94.3 fL (ref 78.0–100.0)
Monocytes Absolute: 0.9 10*3/uL (ref 0.1–1.0)
Monocytes Relative: 6 % (ref 3–12)
NEUTROS PCT: 46 % (ref 43–77)
Neutro Abs: 6.2 10*3/uL (ref 1.7–7.7)
PLATELETS: 371 10*3/uL (ref 150–400)
RBC: 4.93 MIL/uL (ref 3.87–5.11)
RDW: 13.3 % (ref 11.5–15.5)
WBC: 13.6 10*3/uL — ABNORMAL HIGH (ref 4.0–10.5)

## 2014-11-27 MED ORDER — METHYLPREDNISOLONE SODIUM SUCC 125 MG IJ SOLR
125.0000 mg | Freq: Once | INTRAMUSCULAR | Status: AC
  Administered 2014-11-27: 125 mg via INTRAVENOUS

## 2014-11-27 MED ORDER — ALBUTEROL SULFATE (2.5 MG/3ML) 0.083% IN NEBU
5.0000 mg | INHALATION_SOLUTION | Freq: Once | RESPIRATORY_TRACT | Status: AC
  Administered 2014-11-27: 5 mg via RESPIRATORY_TRACT

## 2014-11-27 MED ORDER — IPRATROPIUM BROMIDE 0.02 % IN SOLN
0.5000 mg | Freq: Once | RESPIRATORY_TRACT | Status: AC
  Administered 2014-11-27: 0.5 mg via RESPIRATORY_TRACT
  Filled 2014-11-27: qty 2.5

## 2014-11-27 MED ORDER — ALBUTEROL (5 MG/ML) CONTINUOUS INHALATION SOLN
10.0000 mg/h | INHALATION_SOLUTION | RESPIRATORY_TRACT | Status: DC
  Administered 2014-11-27 (×3): 10 mg/h via RESPIRATORY_TRACT
  Filled 2014-11-27: qty 20

## 2014-11-27 MED ORDER — PREDNISONE 20 MG PO TABS: 40.0000 mg | ORAL_TABLET | Freq: Every day | ORAL | Status: DC

## 2014-11-27 MED ORDER — ALBUTEROL SULFATE HFA 108 (90 BASE) MCG/ACT IN AERS: 2.0000 | INHALATION_SPRAY | RESPIRATORY_TRACT | Status: DC | PRN

## 2014-11-27 MED ORDER — ALBUTEROL SULFATE HFA 108 (90 BASE) MCG/ACT IN AERS: 2.0000 | INHALATION_SPRAY | Freq: Four times a day (QID) | RESPIRATORY_TRACT | Status: AC | PRN

## 2014-11-27 MED ORDER — METHYLPREDNISOLONE SODIUM SUCC 125 MG IJ SOLR
INTRAMUSCULAR | Status: AC
  Administered 2014-11-27: 125 mg via INTRAVENOUS
  Filled 2014-11-27: qty 2

## 2014-11-27 NOTE — Discharge Instructions (Signed)

## 2014-11-27 NOTE — ED Notes (Signed)
Pt noted sleeping with resp deep and reg, o2 sensor has fallen off her finger. Pt awakened, spo2 sensor replaced, o2 sat is 100%. Pt denies any c/o.

## 2014-11-27 NOTE — ED Notes (Signed)
Pt to room 2 with audible wheezing and dyspnea, rt at bedside, pt assisted into gown, pt states "I can't breathe, I feel like I'm dying!"

## 2014-11-27 NOTE — ED Notes (Signed)
Xray will return at 1015 to take pt as she is on an hour long nebulizer treatment.

## 2014-11-27 NOTE — ED Notes (Signed)
Patient transported to X-ray 

## 2014-11-27 NOTE — ED Provider Notes (Signed)
CSN: [id number]     Arrival date & time 11/27/14  0902 History   First MD Initiated Contact with Patient 11/27/14 [phone]     Chief Complaint  Patient presents with  . Shortness of Breath     (Consider location/radiation/quality/duration/timing/severity/associated sxs/prior Treatment) Patient is a 39 y.o. female presenting with shortness of breath. The history is provided by the patient. No language interpreter was used.  Shortness of Breath Severity:  Severe Onset quality:  Gradual Duration:  2 days Timing:  Constant Progression:  Worsening Relieved by:  Nothing Worsened by:  Movement Ineffective treatments:  Inhaler Associated symptoms: no fever, no sore throat and no sputum production   Risk factors: tobacco use     Past Medical History  Diagnosis Date  . Hypertension   . Asthma    History reviewed. No pertinent past surgical history. History reviewed. No pertinent family history. History  Substance Use Topics  . Smoking status: Current Every Day Smoker -- 0.50 packs/day    Types: Cigarettes  . Smokeless tobacco: Never Used  . Alcohol Use: No   OB History    No data available     Review of Systems  Constitutional: Negative for fever.  HENT: Negative for sore throat.   Respiratory: Positive for shortness of breath. Negative for sputum production.   All other systems reviewed and are negative.     Allergies  Review of patient's allergies indicates no known allergies.  Home Medications   Prior to Admission medications   Medication Sig Start Date End Date Taking? Authorizing Provider  albuterol (PROVENTIL HFA;VENTOLIN HFA) 108 (90 BASE) MCG/ACT inhaler Inhale 2 puffs into the lungs every 6 (six) hours as needed for wheezing or shortness of breath. 07/27/13   Ripudeep Jenna LuoK Rai, MD  benzonatate (TESSALON) 100 MG capsule Take 1 capsule (100 mg total) by mouth 3 (three) times daily as needed for cough. 07/27/13   Ripudeep Jenna LuoK Rai, MD  nicotine (NICODERM CQ - DOSED IN MG/24  HOURS) 21 mg/24hr patch Place 1 patch (21 mg total) onto the skin daily. 07/27/13   Ripudeep Jenna LuoK Rai, MD  predniSONE (DELTASONE) 20 MG tablet Take 3 tablets (60 mg total) by mouth daily. 12/15/13   Olivia Mackielga M Otter, MD  predniSONE (STERAPRED UNI-PAK) 10 MG tablet Take by mouth daily. Take as directed on the pack 07/27/13   Ripudeep K Rai, MD   BP 173/128 mmHg  Pulse 66  Temp(Src) 97.8 F (36.6 C) (Axillary)  Resp 40  SpO2 94%  LMP 11/15/2014 Physical Exam  Constitutional: She is oriented to person, place, and time. She appears well-developed and well-nourished.  HENT:  Head: Normocephalic.  Right Ear: External ear normal.  Left Ear: External ear normal.  Eyes: Conjunctivae and EOM are normal. Pupils are equal, round, and reactive to light.  Cardiovascular: Normal rate and regular rhythm.   Pulmonary/Chest: She is in respiratory distress. She has wheezes.  Musculoskeletal: Normal range of motion.  Neurological: She is alert and oriented to person, place, and time.  Skin: Skin is warm and dry.  Psychiatric: She has a normal mood and affect.  Nursing note and vitals reviewed.   ED Course  Procedures (including critical care time) Labs Review Labs Reviewed - No data to display  Imaging Review Dg Chest 2 View  11/27/2014   CLINICAL DATA:  Shortness of breath, wheezing, cough since yesterday. History of asthma.  EXAM: CHEST  2 VIEW  COMPARISON:  05/07/2014  FINDINGS: The heart size and mediastinal contours  are within normal limits. Both lungs are clear. The visualized skeletal structures are unremarkable.  IMPRESSION: No active cardiopulmonary disease.   Electronically Signed   By: Charlett Nose M.D.   On: 11/27/2014 10:39     EKG Interpretation None      MDM   Final diagnoses:  SOB (shortness of breath)  COPD exacerbation    Pt is doing better at this time. Will send home with prednisone and new inhaler.pt given return preautions    Teressa Lower, NP 11/27/14 1248  Geoffery Lyons, MD 11/30/14 [phone]

## 2014-12-13 ENCOUNTER — Encounter (HOSPITAL_BASED_OUTPATIENT_CLINIC_OR_DEPARTMENT_OTHER): Payer: Self-pay | Admitting: *Deleted

## 2014-12-13 ENCOUNTER — Emergency Department (HOSPITAL_BASED_OUTPATIENT_CLINIC_OR_DEPARTMENT_OTHER)
Admission: EM | Admit: 2014-12-13 | Discharge: 2014-12-14 | Disposition: A | Discharge status: Home / Self Care | Payer: Medicaid Other | Attending: Emergency Medicine | Admitting: Emergency Medicine

## 2014-12-13 ENCOUNTER — Emergency Department (HOSPITAL_BASED_OUTPATIENT_CLINIC_OR_DEPARTMENT_OTHER): Payer: Medicaid Other

## 2014-12-13 DIAGNOSIS — Z720 Tobacco use: Secondary | ICD-10-CM | POA: Diagnosis not present

## 2014-12-13 DIAGNOSIS — I10 Essential (primary) hypertension: Secondary | ICD-10-CM | POA: Insufficient documentation

## 2014-12-13 DIAGNOSIS — Z79899 Other long term (current) drug therapy: Secondary | ICD-10-CM | POA: Insufficient documentation

## 2014-12-13 DIAGNOSIS — J441 Chronic obstructive pulmonary disease with (acute) exacerbation: Secondary | ICD-10-CM | POA: Diagnosis not present

## 2014-12-13 DIAGNOSIS — R0602 Shortness of breath: Secondary | ICD-10-CM | POA: Diagnosis present

## 2014-12-13 HISTORY — DX: Chronic obstructive pulmonary disease, unspecified: J44.9

## 2014-12-13 MED ORDER — DOXYCYCLINE HYCLATE 100 MG PO CAPS: 100.0000 mg | ORAL_CAPSULE | Freq: Two times a day (BID) | ORAL | Status: DC

## 2014-12-13 MED ORDER — PREDNISONE 10 MG PO TABS
60.0000 mg | ORAL_TABLET | Freq: Once | ORAL | Status: AC
  Administered 2014-12-13: 60 mg via ORAL
  Filled 2014-12-13 (×2): qty 1

## 2014-12-13 MED ORDER — ALBUTEROL SULFATE (2.5 MG/3ML) 0.083% IN NEBU
5.0000 mg | INHALATION_SOLUTION | Freq: Once | RESPIRATORY_TRACT | Status: AC
  Administered 2014-12-13: 5 mg via RESPIRATORY_TRACT

## 2014-12-13 MED ORDER — ALBUTEROL SULFATE (2.5 MG/3ML) 0.083% IN NEBU
5.0000 mg | INHALATION_SOLUTION | Freq: Once | RESPIRATORY_TRACT | Status: AC
  Administered 2014-12-13: 5 mg via RESPIRATORY_TRACT
  Filled 2014-12-13: qty 6

## 2014-12-13 MED ORDER — IPRATROPIUM BROMIDE 0.02 % IN SOLN
0.5000 mg | Freq: Once | RESPIRATORY_TRACT | Status: AC
  Administered 2014-12-13: 0.5 mg via RESPIRATORY_TRACT

## 2014-12-13 MED ORDER — ALBUTEROL SULFATE (2.5 MG/3ML) 0.083% IN NEBU
INHALATION_SOLUTION | RESPIRATORY_TRACT | Status: AC
  Filled 2014-12-13: qty 6

## 2014-12-13 MED ORDER — PREDNISONE 10 MG PO TABS: 20.0000 mg | ORAL_TABLET | Freq: Every day | ORAL | Status: DC

## 2014-12-13 MED ORDER — IPRATROPIUM BROMIDE 0.02 % IN SOLN
RESPIRATORY_TRACT | Status: AC
  Filled 2014-12-13: qty 2.5

## 2014-12-13 NOTE — ED Notes (Signed)
Productive yellow sputum cough.

## 2014-12-13 NOTE — ED Provider Notes (Addendum)
CSN: [id number]     Arrival date & time 12/13/14  2051 History   This chart was scribed for Rolan Bucco, MD by Freida Busman, ED Scribe. This patient was seen in room MH01/MH01 and the patient's care was started 9:49 PM.    Chief Complaint  Patient presents with  . Shortness of Breath   The history is provided by the patient. No language interpreter was used.     HPI Comments:  [name] [name] is a 39 y.o. female with a h/o COPD who presents to the Emergency Department complaining of wheezing and SOB that worsened yesterday. She reports associated productive cough with yellow phlegm. She has been using her albuterol inhaler- 2 puffs every 2 hours- with minimal relief. She deneis CP, fever, and swelling to her BLE. Pt states she was seen in the ED ~2weeks ago for trouble breathing. She notes that episode was much worse and she'd been doing well since discharge until yesterday.   Pt states she is currenly a smoker.  Past Medical History  Diagnosis Date  . Hypertension   . Asthma   . COPD (chronic obstructive pulmonary disease)    History reviewed. No pertinent past surgical history. History reviewed. No pertinent family history. History  Substance Use Topics  . Smoking status: Current Every Day Smoker -- 0.50 packs/day    Types: Cigarettes  . Smokeless tobacco: Never Used  . Alcohol Use: No   OB History    No data available     Review of Systems  Constitutional: Negative for fever, chills, diaphoresis and fatigue.  HENT: Negative for congestion, rhinorrhea and sneezing.   Eyes: Negative.   Respiratory: Positive for cough, shortness of breath and wheezing. Negative for chest tightness.   Cardiovascular: Negative for chest pain and leg swelling.  Gastrointestinal: Negative for nausea, vomiting, abdominal pain, diarrhea and blood in stool.  Genitourinary: Negative for frequency, hematuria, flank pain and difficulty urinating.  Musculoskeletal: Negative for back pain and  arthralgias.  Skin: Negative for rash.  Neurological: Negative for dizziness, speech difficulty, weakness, numbness and headaches.  All other systems reviewed and are negative.     Allergies  Review of patient's allergies indicates no known allergies.  Home Medications   Prior to Admission medications   Medication Sig Start Date End Date Taking? Authorizing Provider  albuterol (PROVENTIL HFA;VENTOLIN HFA) 108 (90 BASE) MCG/ACT inhaler Inhale 2 puffs into the lungs every 6 (six) hours as needed for wheezing or shortness of breath. 11/27/14   Teressa Lower, NP  predniSONE (DELTASONE) 10 MG tablet Take 2 tablets (20 mg total) by mouth daily. 12/13/14   Rolan Bucco, MD   BP 136/83 mmHg  Pulse 67  Temp(Src) 99.5 F (37.5 C) (Oral)  Resp 18  Ht  (1.676 m)  Wt 155 lb (70.308 kg)  BMI 25.03 kg/m2  SpO2 93%  LMP 11/08/2014 Physical Exam  Constitutional: She is oriented to person, place, and time. She appears well-developed and well-nourished.  HENT:  Head: Normocephalic and atraumatic.  Eyes: Pupils are equal, round, and reactive to light.  Neck: Normal range of motion. Neck supple.  Cardiovascular: Normal rate, regular rhythm and normal heart sounds.   Pulmonary/Chest: Effort normal. No respiratory distress. She has wheezes. She has no rales. She exhibits no tenderness.  Diminished breath sounds bilaterally with some increased work of breathing  Abdominal: Soft. Bowel sounds are normal. There is no tenderness. There is no rebound and no guarding.  Musculoskeletal: Normal range of motion. She  exhibits no edema.  No calf tenderness  Lymphadenopathy:    She has no cervical adenopathy.  Neurological: She is alert and oriented to person, place, and time.  Skin: Skin is warm and dry. No rash noted.  Psychiatric: She has a normal mood and affect.    ED Course  Procedures   DIAGNOSTIC STUDIES:  Oxygen Saturation is 95% on RA, normal by my interpretation.    COORDINATION  OF CARE:  9:54PM Will order second breathing treatment and steroids. Discussed treatment plan with pt at bedside and pt agreed to plan.  Labs Review Labs Reviewed - No data to display  Imaging Review Dg Chest 2 View  12/13/2014   CLINICAL DATA:  Two day history of productive cough and congestion  EXAM: CHEST  2 VIEW  COMPARISON:  November 27, 2014  FINDINGS: Lungs are mildly hyperexpanded but clear. Heart size and pulmonary vascularity are normal. No adenopathy. There is mild lower thoracic dextroscoliosis.  IMPRESSION: Lungs mildly hyperexpanded but clear.   Electronically Signed   By: Bretta BangWilliam  Woodruff III M.D.   On: 12/13/2014 21:40     EKG Interpretation None      MDM   Final diagnoses:  COPD exacerbation    Patient presents with a COPD exacerbation. She got 3 nebulizer treatments here and is feeling much better after this. Her oxygen saturations are in the mid 90s following this. She does have a productive cough with sputum color change. I will go ahead and start her on doxycycline. Also go ahead and give her a another steroid pack. She was given dose of prednisone here in the ED. She's talking in full sentences with no increased work of breathing. She feels like she's ready to go home and back to baseline. It does seem like her COPD is not well controlled at baseline. She's had frequent ED visits recently. I advised her to follow-up with her primary care physician to see if she needs to change her ongoing management. I advised her return here if she has worsening symptoms. She doesn't have any other symptoms that would be more suggestive of other etiology such as pulmonary embolus.  Pt ambulated without hypoxia.  Her LMP was 11/08/14 but she refused pregnancy test because she is on Mirena  I personally performed the services described in this documentation, which was scribed in my presence.  The recorded information has been reviewed and considered.    Rolan BuccoMelanie Keilany Burnette, MD 12/13/14  [id number]  Rolan BuccoMelanie Sage Kopera, MD 12/14/14 [phone]

## 2014-12-13 NOTE — Discharge Instructions (Signed)
Chronic Asthmatic Bronchitis Chronic asthmatic bronchitis is a complication of persistent asthma. After a period of time with asthma, some people develop airflow obstruction that is present all the time, even when not having an asthma attack.There is also persistent inflammation of the airways, and the bronchial tubes produce more mucus. Chronic asthmatic bronchitis usually is a permanent problem with the lungs. CAUSES  Chronic asthmatic bronchitis happens most often in people who have asthma and also smoke cigarettes. Occasionally, it can happen to a person with long-standing or severe asthma even if the person is not a smoker. SIGNS AND SYMPTOMS  Chronic asthmatic bronchitis usually causes symptoms of both asthma and chronic bronchitis, including:   Coughing.  Increased sputum production.  Wheezing and shortness of breath.  Chest discomfort.  Recurring infections. DIAGNOSIS  Your health care provider will take a medical history and perform a physical exam. Chronic asthmatic bronchitis is suspected when a person with asthma has abnormal results on breathing tests (pulmonary function tests) even when breathing symptoms are at their best. Other tests, such as a chest X-ray, may be performed to rule out other conditions.  TREATMENT  Treatment involves controlling symptoms with medicine and lifestyle changes.  Your health care provider may prescribe asthma medicines, including inhaler and nebulizer medicines.  Infection can be treated with medicine to kill germs (antibiotics). Serious infections may require hospitalization. These can include:  Pneumonia.  Sinus infections.  Acute bronchitis.   Preventing infection and hospitalization is very important. Get an influenza vaccination every year as directed by your health care provider. Ask your health care provider whether you need a pneumonia vaccine.  Ask your health care provider whether you would benefit from a pulmonary  rehabilitation program. HOME CARE INSTRUCTIONS  Take medicines only as directed by your health care provider.  If you are a cigarette smoker, the most important thing that you can do is quit. Talk to your health care provider for help with quitting smoking.  Avoid pollen, dust, animal dander, molds, smoke, and other things that cause attacks.  Regular exercise is very important to help you feel better. Discuss possible exercise routines with your health care provider.  If animal dander is the cause of asthma, you may not be able to keep pets.  It is important that you:  Become educated about your medical condition.  Participate in maintaining wellness.  Seek medical care as directed. Delay in seeking medical care could cause permanent injury and may be a risk to your life. SEEK MEDICAL CARE IF:  You have wheezing and shortness of breath even if taking medicine to prevent attacks.  You have muscle aches, chest pain, or thickening of sputum.  Your sputum changes from clear or white to yellow, green, gray, or bloody. SEEK IMMEDIATE MEDICAL CARE IF:  Your usual medicines do not stop your wheezing.  You have increased coughing or shortness of breath or both.  You have increased difficulty breathing.  You have any problems from the medicine you are taking, such as a rash, itching, swelling, or trouble breathing. MAKE SURE YOU:   Understand these instructions.  Will watch your condition.  Will get help right away if you are not doing well or get worse. Document Released: 06/26/2006 Document Revised: 01/23/2014 Document Reviewed: 10/17/2013 Exit[employer] Patient Information 2015 [employer]. This information is not intended to replace advice given to you by your health care provider. Make sure you discuss any questions you have with your health care provider.  

## 2014-12-13 NOTE — ED Notes (Signed)
Pt reports SOB that started yesterday.  Audible inspiratory and expiratory wheezing noted. Denies pain.

## 2014-12-14 NOTE — Progress Notes (Signed)
Patient ambulated around the department while on pulse oximeter.  Patient's SPO2 remained between 95% and 97%.

## 2015-01-13 ENCOUNTER — Inpatient Hospital Stay (HOSPITAL_BASED_OUTPATIENT_CLINIC_OR_DEPARTMENT_OTHER)
Admission: EM | Admit: 2015-01-13 | Discharge: 2015-01-14 | DRG: 190 | Discharge status: Against Medical Advice | Payer: Medicaid Other | Attending: Internal Medicine | Admitting: Internal Medicine

## 2015-01-13 ENCOUNTER — Emergency Department (HOSPITAL_BASED_OUTPATIENT_CLINIC_OR_DEPARTMENT_OTHER): Payer: Medicaid Other

## 2015-01-13 ENCOUNTER — Encounter (HOSPITAL_BASED_OUTPATIENT_CLINIC_OR_DEPARTMENT_OTHER): Payer: Self-pay | Admitting: Emergency Medicine

## 2015-01-13 DIAGNOSIS — R062 Wheezing: Secondary | ICD-10-CM

## 2015-01-13 DIAGNOSIS — J9600 Acute respiratory failure, unspecified whether with hypoxia or hypercapnia: Secondary | ICD-10-CM | POA: Diagnosis present

## 2015-01-13 DIAGNOSIS — F411 Generalized anxiety disorder: Secondary | ICD-10-CM | POA: Diagnosis present

## 2015-01-13 DIAGNOSIS — J440 Chronic obstructive pulmonary disease with acute lower respiratory infection: Secondary | ICD-10-CM | POA: Diagnosis present

## 2015-01-13 DIAGNOSIS — I10 Essential (primary) hypertension: Secondary | ICD-10-CM | POA: Diagnosis present

## 2015-01-13 DIAGNOSIS — J449 Chronic obstructive pulmonary disease, unspecified: Secondary | ICD-10-CM | POA: Diagnosis present

## 2015-01-13 DIAGNOSIS — J45901 Unspecified asthma with (acute) exacerbation: Secondary | ICD-10-CM | POA: Diagnosis present

## 2015-01-13 DIAGNOSIS — I248 Other forms of acute ischemic heart disease: Secondary | ICD-10-CM | POA: Diagnosis present

## 2015-01-13 DIAGNOSIS — J9621 Acute and chronic respiratory failure with hypoxia: Secondary | ICD-10-CM | POA: Diagnosis not present

## 2015-01-13 DIAGNOSIS — J45909 Unspecified asthma, uncomplicated: Secondary | ICD-10-CM | POA: Diagnosis present

## 2015-01-13 DIAGNOSIS — R7989 Other specified abnormal findings of blood chemistry: Secondary | ICD-10-CM | POA: Diagnosis present

## 2015-01-13 DIAGNOSIS — F419 Anxiety disorder, unspecified: Secondary | ICD-10-CM | POA: Diagnosis present

## 2015-01-13 DIAGNOSIS — Z87891 Personal history of nicotine dependence: Secondary | ICD-10-CM

## 2015-01-13 DIAGNOSIS — J209 Acute bronchitis, unspecified: Secondary | ICD-10-CM | POA: Diagnosis present

## 2015-01-13 DIAGNOSIS — R0603 Acute respiratory distress: Secondary | ICD-10-CM | POA: Diagnosis present

## 2015-01-13 DIAGNOSIS — J441 Chronic obstructive pulmonary disease with (acute) exacerbation: Secondary | ICD-10-CM | POA: Diagnosis present

## 2015-01-13 DIAGNOSIS — R778 Other specified abnormalities of plasma proteins: Secondary | ICD-10-CM | POA: Diagnosis present

## 2015-01-13 DIAGNOSIS — J9601 Acute respiratory failure with hypoxia: Secondary | ICD-10-CM | POA: Diagnosis present

## 2015-01-13 DIAGNOSIS — Z720 Tobacco use: Secondary | ICD-10-CM | POA: Diagnosis present

## 2015-01-13 DIAGNOSIS — I160 Hypertensive urgency: Secondary | ICD-10-CM | POA: Diagnosis present

## 2015-01-13 DIAGNOSIS — F1910 Other psychoactive substance abuse, uncomplicated: Secondary | ICD-10-CM | POA: Diagnosis present

## 2015-01-13 DIAGNOSIS — R0600 Dyspnea, unspecified: Secondary | ICD-10-CM | POA: Diagnosis not present

## 2015-01-13 LAB — BRAIN NATRIURETIC PEPTIDE: B Natriuretic Peptide: 103.6 pg/mL — ABNORMAL HIGH (ref 0.0–100.0)

## 2015-01-13 LAB — RAPID URINE DRUG SCREEN, HOSP PERFORMED
Amphetamines: NOT DETECTED
BARBITURATES: NOT DETECTED
BENZODIAZEPINES: NOT DETECTED
COCAINE: NOT DETECTED
Opiates: POSITIVE — AB
Tetrahydrocannabinol: NOT DETECTED

## 2015-01-13 LAB — CBC WITH DIFFERENTIAL/PLATELET
Basophils Absolute: 0 10*3/uL (ref 0.0–0.1)
Basophils Relative: 0 % (ref 0–1)
Eosinophils Absolute: 1.3 10*3/uL — ABNORMAL HIGH (ref 0.0–0.7)
Eosinophils Relative: 8 % — ABNORMAL HIGH (ref 0–5)
HCT: 47 % — ABNORMAL HIGH (ref 36.0–46.0)
Hemoglobin: 16.1 g/dL — ABNORMAL HIGH (ref 12.0–15.0)
Lymphocytes Relative: 20 % (ref 12–46)
Lymphs Abs: 3.5 10*3/uL (ref 0.7–4.0)
MCH: 31.8 pg (ref 26.0–34.0)
MCHC: 34.3 g/dL (ref 30.0–36.0)
MCV: 92.9 fL (ref 78.0–100.0)
MONOS PCT: 7 % (ref 3–12)
Monocytes Absolute: 1.2 10*3/uL — ABNORMAL HIGH (ref 0.1–1.0)
NEUTROS ABS: 11.6 10*3/uL — AB (ref 1.7–7.7)
Neutrophils Relative %: 65 % (ref 43–77)
Platelets: 389 10*3/uL (ref 150–400)
RBC: 5.06 MIL/uL (ref 3.87–5.11)
RDW: 12.9 % (ref 11.5–15.5)
WBC: 17.7 10*3/uL — ABNORMAL HIGH (ref 4.0–10.5)

## 2015-01-13 LAB — URINALYSIS, ROUTINE W REFLEX MICROSCOPIC
Bilirubin Urine: NEGATIVE
GLUCOSE, UA: 500 mg/dL — AB
KETONES UR: NEGATIVE mg/dL
LEUKOCYTES UA: NEGATIVE
NITRITE: NEGATIVE
Protein, ur: NEGATIVE mg/dL
SPECIFIC GRAVITY, URINE: 1.019 (ref 1.005–1.030)
Urobilinogen, UA: 0.2 mg/dL (ref 0.0–1.0)
pH: 5 (ref 5.0–8.0)

## 2015-01-13 LAB — I-STAT ARTERIAL BLOOD GAS, ED
ACID-BASE DEFICIT: 2 mmol/L (ref 0.0–2.0)
Bicarbonate: 23.7 mEq/L (ref 20.0–24.0)
O2 SAT: 92 %
PCO2 ART: 41.1 mmHg (ref 35.0–45.0)
Patient temperature: 97.6
TCO2: 25 mmol/L (ref 0–100)
pH, Arterial: 7.365 (ref 7.350–7.450)
pO2, Arterial: 63 mmHg — ABNORMAL LOW (ref 80.0–100.0)

## 2015-01-13 LAB — BASIC METABOLIC PANEL
Anion gap: 5 (ref 5–15)
BUN: 10 mg/dL (ref 6–23)
CALCIUM: 9.3 mg/dL (ref 8.4–10.5)
CO2: 28 mmol/L (ref 19–32)
Chloride: 106 mmol/L (ref 96–112)
Creatinine, Ser: 0.9 mg/dL (ref 0.50–1.10)
GFR calc Af Amer: >90 mL/min (ref >90)
GFR, EST NON AFRICAN AMERICAN: 80 mL/min — AB (ref >90)
GLUCOSE: 109 mg/dL — AB (ref 70–99)
Potassium: 3.9 mmol/L (ref 3.5–5.1)
Sodium: 139 mmol/L (ref 135–145)

## 2015-01-13 LAB — TROPONIN I: Troponin I: 0.24 ng/mL — ABNORMAL HIGH (ref <0.031)

## 2015-01-13 LAB — INFLUENZA PANEL BY PCR (TYPE A & B)
H1N1 flu by pcr: NOT DETECTED
Influenza A By PCR: NEGATIVE
Influenza B By PCR: NEGATIVE

## 2015-01-13 LAB — URINE MICROSCOPIC-ADD ON

## 2015-01-13 LAB — MAGNESIUM: Magnesium: 2.6 mg/dL — ABNORMAL HIGH (ref 1.5–2.5)

## 2015-01-13 LAB — PREGNANCY, URINE: PREG TEST UR: NEGATIVE

## 2015-01-13 LAB — MRSA PCR SCREENING: MRSA by PCR: NEGATIVE

## 2015-01-13 MED ORDER — METHYLPREDNISOLONE SODIUM SUCC 125 MG IJ SOLR
60.0000 mg | Freq: Two times a day (BID) | INTRAMUSCULAR | Status: DC
  Administered 2015-01-13 – 2015-01-14 (×2): 60 mg via INTRAVENOUS
  Filled 2015-01-13 (×2): qty 2
  Filled 2015-01-13 (×2): qty 0.96

## 2015-01-13 MED ORDER — DEXTROSE 5 % IV SOLN
500.0000 mg | INTRAVENOUS | Status: DC
  Administered 2015-01-14: 500 mg via INTRAVENOUS
  Filled 2015-01-13 (×2): qty 500

## 2015-01-13 MED ORDER — ACETAMINOPHEN 650 MG RE SUPP: 650.0000 mg | Freq: Four times a day (QID) | RECTAL | Status: DC | PRN

## 2015-01-13 MED ORDER — CEFTRIAXONE SODIUM IN DEXTROSE 20 MG/ML IV SOLN
1.0000 g | INTRAVENOUS | Status: DC
  Administered 2015-01-14: 1 g via INTRAVENOUS
  Filled 2015-01-13 (×2): qty 50

## 2015-01-13 MED ORDER — SODIUM CHLORIDE 0.9 % IV SOLN
INTRAVENOUS | Status: DC
  Administered 2015-01-13 – 2015-01-14 (×3): via INTRAVENOUS

## 2015-01-13 MED ORDER — CETYLPYRIDINIUM CHLORIDE 0.05 % MT LIQD
7.0000 mL | Freq: Two times a day (BID) | OROMUCOSAL | Status: DC
  Administered 2015-01-13 – 2015-01-14 (×3): 7 mL via OROMUCOSAL

## 2015-01-13 MED ORDER — HYDRALAZINE HCL 20 MG/ML IJ SOLN: 10.0000 mg | Freq: Four times a day (QID) | INTRAMUSCULAR | Status: DC | PRN

## 2015-01-13 MED ORDER — DEXTROSE 5 % IV SOLN
500.0000 mg | Freq: Once | INTRAVENOUS | Status: AC
  Administered 2015-01-13: 500 mg via INTRAVENOUS

## 2015-01-13 MED ORDER — ALBUTEROL (5 MG/ML) CONTINUOUS INHALATION SOLN
20.0000 mg/h | INHALATION_SOLUTION | RESPIRATORY_TRACT | Status: AC
Start: 2015-01-13 — End: 2015-01-13
  Administered 2015-01-13: 20 mg/h via RESPIRATORY_TRACT
  Filled 2015-01-13: qty 20

## 2015-01-13 MED ORDER — DOCUSATE SODIUM 100 MG PO CAPS
100.0000 mg | ORAL_CAPSULE | Freq: Two times a day (BID) | ORAL | Status: DC
  Administered 2015-01-13 – 2015-01-14 (×3): 100 mg via ORAL
  Filled 2015-01-13 (×4): qty 1

## 2015-01-13 MED ORDER — LORAZEPAM 2 MG/ML IJ SOLN
0.5000 mg | Freq: Four times a day (QID) | INTRAMUSCULAR | Status: DC | PRN
  Administered 2015-01-13 – 2015-01-14 (×3): 0.5 mg via INTRAVENOUS
  Filled 2015-01-13 (×3): qty 1

## 2015-01-13 MED ORDER — GUAIFENESIN ER 600 MG PO TB12
1200.0000 mg | ORAL_TABLET | Freq: Two times a day (BID) | ORAL | Status: DC
  Administered 2015-01-13 – 2015-01-14 (×3): 1200 mg via ORAL
  Filled 2015-01-13 (×4): qty 2

## 2015-01-13 MED ORDER — SODIUM CHLORIDE 0.9 % IJ SOLN
3.0000 mL | Freq: Two times a day (BID) | INTRAMUSCULAR | Status: DC
  Administered 2015-01-14: 3 mL via INTRAVENOUS

## 2015-01-13 MED ORDER — ACETAMINOPHEN 325 MG PO TABS: 650.0000 mg | ORAL_TABLET | Freq: Four times a day (QID) | ORAL | Status: DC | PRN

## 2015-01-13 MED ORDER — ENOXAPARIN SODIUM 40 MG/0.4ML ~~LOC~~ SOLN
40.0000 mg | SUBCUTANEOUS | Status: DC
  Administered 2015-01-13 – 2015-01-14 (×2): 40 mg via SUBCUTANEOUS
  Filled 2015-01-13 (×2): qty 0.4

## 2015-01-13 MED ORDER — METHYLPREDNISOLONE SODIUM SUCC 125 MG IJ SOLR
125.0000 mg | Freq: Once | INTRAMUSCULAR | Status: AC
  Administered 2015-01-13: 125 mg via INTRAVENOUS
  Filled 2015-01-13: qty 2

## 2015-01-13 MED ORDER — ONDANSETRON HCL 4 MG/2ML IJ SOLN: 4.0000 mg | Freq: Four times a day (QID) | INTRAMUSCULAR | Status: DC | PRN

## 2015-01-13 MED ORDER — DEXTROSE 5 % IV SOLN
1.0000 g | Freq: Once | INTRAVENOUS | Status: AC
  Administered 2015-01-13: 1 g via INTRAVENOUS

## 2015-01-13 MED ORDER — AZITHROMYCIN 500 MG IV SOLR
INTRAVENOUS | Status: AC
  Filled 2015-01-13: qty 500

## 2015-01-13 MED ORDER — ONDANSETRON HCL 4 MG PO TABS
4.0000 mg | ORAL_TABLET | Freq: Four times a day (QID) | ORAL | Status: DC | PRN
Start: 2015-01-13 — End: 2015-01-14

## 2015-01-13 MED ORDER — SODIUM CHLORIDE 0.9 % IJ SOLN: 3.0000 mL | INTRAMUSCULAR | Status: DC | PRN

## 2015-01-13 MED ORDER — LORATADINE 10 MG PO TABS
10.0000 mg | ORAL_TABLET | Freq: Once | ORAL | Status: AC
  Administered 2015-01-13: 10 mg via ORAL
  Filled 2015-01-13: qty 1

## 2015-01-13 MED ORDER — SODIUM CHLORIDE 0.9 % IJ SOLN: 3.0000 mL | Freq: Two times a day (BID) | INTRAMUSCULAR | Status: DC

## 2015-01-13 MED ORDER — ENSURE ENLIVE PO LIQD
237.0000 mL | Freq: Two times a day (BID) | ORAL | Status: DC
  Administered 2015-01-14 (×2): 237 mL via ORAL

## 2015-01-13 MED ORDER — IPRATROPIUM-ALBUTEROL 0.5-2.5 (3) MG/3ML IN SOLN
3.0000 mL | RESPIRATORY_TRACT | Status: DC | PRN
  Administered 2015-01-14 (×2): 3 mL via RESPIRATORY_TRACT
  Filled 2015-01-13 (×2): qty 3

## 2015-01-13 MED ORDER — OXYCODONE HCL 5 MG PO TABS: 5.0000 mg | ORAL_TABLET | ORAL | Status: DC | PRN

## 2015-01-13 MED ORDER — SODIUM CHLORIDE 0.9 % IV SOLN: 250.0000 mL | INTRAVENOUS | Status: DC | PRN

## 2015-01-13 MED ORDER — PNEUMOCOCCAL VAC POLYVALENT 25 MCG/0.5ML IJ INJ
0.5000 mL | INJECTION | INTRAMUSCULAR | Status: AC
  Administered 2015-01-14: 0.5 mL via INTRAMUSCULAR
  Filled 2015-01-13: qty 0.5

## 2015-01-13 MED ORDER — ALUM & MAG HYDROXIDE-SIMETH 200-200-20 MG/5ML PO SUSP: 30.0000 mL | Freq: Four times a day (QID) | ORAL | Status: DC | PRN

## 2015-01-13 MED ORDER — IPRATROPIUM BROMIDE 0.02 % IN SOLN
0.5000 mg | Freq: Once | RESPIRATORY_TRACT | Status: AC
  Administered 2015-01-13: 0.5 mg via RESPIRATORY_TRACT
  Filled 2015-01-13: qty 2.5

## 2015-01-13 MED ORDER — CEFTRIAXONE SODIUM 1 G IJ SOLR
INTRAMUSCULAR | Status: AC
  Filled 2015-01-13: qty 10

## 2015-01-13 NOTE — ED Provider Notes (Signed)
CSN: [id number]     Arrival date & time 01/13/15  4098 History   First MD Initiated Contact with Patient 01/13/15 [phone]     Chief Complaint  Patient presents with  . Shortness of Breath     (Consider location/radiation/quality/duration/timing/severity/associated sxs/prior Treatment) Patient is a 39 y.o. female presenting with shortness of breath. The history is provided by the patient. The history is limited by the condition of the patient.  Shortness of Breath Severity:  Severe Onset quality:  Gradual Duration:  1 day Timing:  Constant Progression:  Worsening Chronicity:  Recurrent Context: not weather changes   Relieved by:  Nothing Worsened by:  Nothing tried Ineffective treatments:  Inhaler Associated symptoms: cough and wheezing   Associated symptoms: no fever   Risk factors: no prolonged immobilization     Past Medical History  Diagnosis Date  . Hypertension   . Asthma   . COPD (chronic obstructive pulmonary disease)    History reviewed. No pertinent past surgical history. History reviewed. No pertinent family history. History  Substance Use Topics  . Smoking status: Former Smoker -- 0.00 packs/day    Quit date: 12/23/2014  . Smokeless tobacco: Never Used  . Alcohol Use: No   OB History    No data available     Review of Systems  Constitutional: Negative for fever.  Respiratory: Positive for cough, shortness of breath and wheezing.   All other systems reviewed and are negative.     Allergies  Review of patient's allergies indicates no known allergies.  Home Medications   Prior to Admission medications   Medication Sig Start Date End Date Taking? Authorizing Provider  albuterol (PROVENTIL HFA;VENTOLIN HFA) 108 (90 BASE) MCG/ACT inhaler Inhale 2 puffs into the lungs every 6 (six) hours as needed for wheezing or shortness of breath. 11/27/14   Teressa Lower, NP  doxycycline (VIBRAMYCIN) 100 MG capsule Take 1 capsule (100 mg total) by mouth 2 (two)  times daily. One po bid x 7 days 12/13/14   Rolan Bucco, MD  predniSONE (DELTASONE) 10 MG tablet Take 2 tablets (20 mg total) by mouth daily. 12/13/14   Rolan Bucco, MD   BP 176/113 mmHg  Pulse 78  Temp(Src) 97.6 F (36.4 C) (Oral)  Wt 145 lb (65.772 kg)  SpO2 87%  LMP 01/07/2015 Physical Exam  Constitutional: She is oriented to person, place, and time. She appears well-developed and well-nourished.  HENT:  Head: Normocephalic and atraumatic.  Mouth/Throat: Oropharynx is clear and moist.  Eyes: Conjunctivae are normal. Pupils are equal, round, and reactive to light.  Neck: Normal range of motion. Neck supple.  Cardiovascular: Normal rate, regular rhythm and intact distal pulses.   Pulmonary/Chest: No stridor. She is in respiratory distress. She has wheezes. She has no rales. She exhibits no tenderness.  Abdominal: Soft. Bowel sounds are normal. There is no tenderness.  Musculoskeletal: Normal range of motion. She exhibits no edema.  Neurological: She is alert and oriented to person, place, and time.  Skin: Skin is warm and dry. She is not diaphoretic.  Psychiatric: She has a normal mood and affect.    ED Course  Procedures (including critical care time) Labs Review Labs Reviewed  CBC WITH DIFFERENTIAL/PLATELET  BASIC METABOLIC PANEL  PREGNANCY, URINE  MAGNESIUM    Imaging Review No results found.   EKG Interpretation None      MDM   Final diagnoses:  Wheeze   Medications  albuterol (PROVENTIL,VENTOLIN) solution continuous neb (20 mg/hr Nebulization New Bag/Given 01/13/15  [id number])  cefTRIAXone (ROCEPHIN) 1 g in dextrose 5 % 50 mL IVPB (not administered)  azithromycin (ZITHROMAX) 500 mg in dextrose 5 % 250 mL IVPB (not administered)  ipratropium (ATROVENT) nebulizer solution 0.5 mg (0.5 mg Nebulization Given 01/13/15 [id number])  methylPREDNISolone sodium succinate (SOLU-MEDROL) 125 mg/2 mL injection 125 mg (125 mg Intravenous Given 01/13/15 0638)  loratadine (CLARITIN)  tablet 10 mg (10 mg Oral Given 01/13/15 [id number])     MDM Reviewed: previous chart, nursing note and vitals Reviewed previous: labs and x-ray Interpretation: labs and x-ray (elevated white count, low O2 acidotic, no PNA on CXR by me) Total time providing critical care: 75-105 minutes. This excludes time spent performing separately reportable procedures and services. Consults: admitting MD  CRITICAL CARE Performed by: Jasmine AwePALUMBO-RASCH,Tawanna Funk K Total critical care time: 90 minutes Critical care time was exclusive of separately billable procedures and treating other patients. Critical care was necessary to treat or prevent imminent or life-threatening deterioration. Critical care was time spent personally by me on the following activities: development of treatment plan with patient and/or surrogate as well as nursing, discussions with consultants, evaluation of patient's response to treatment, examination of patient, obtaining history from patient or surrogate, ordering and performing treatments and interventions, ordering and review of laboratory studies, ordering and review of radiographic studies, pulse oximetry and re-evaluation of patient's condition.    Inpatient stepdown d/w Dr. Jomarie LongsJoseph   Cy BlamerApril Laporshia Hogen, MD 01/13/15 [phone]

## 2015-01-13 NOTE — Progress Notes (Signed)
INITIAL NUTRITION ASSESSMENT  INTERVENTION: Ensure Enlive po BID, each supplement provides 350 kcal and 20 grams of protein  NUTRITION DIAGNOSIS: Inadequate oral intake related to SOB/anxiety as evidenced by per pt report.   Goal: Pt to meet >/= 90% of their estimated nutrition needs   Monitor:  PO intake, supplement acceptance  Reason for Assessment: MD consult  ASSESSMENT: Pt recently dx with asthma/COPD in Nov 2015 admitted now with exacerbation.  Pt reports PTA pt has been having episodes of SOB making her anxious and she cannot eat despite being hungry. Lunch at bedside pt had consumed 25% of her meal but states she is hungry and would like more but is scared. Offered pt ensure which she will take.  No signs of fat or muscle depletion noted.   Height: Ht Readings from Last 1 Encounters:  01/13/15 5\' 6"  (1.676 m)    Weight: Wt Readings from Last 1 Encounters:  01/13/15 149 lb 14.6 oz (68 kg)    Ideal Body Weight: 59 kg   % Ideal Body Weight: 115%  Wt Readings from Last 10 Encounters:  01/13/15 149 lb 14.6 oz (68 kg)  12/13/14 155 lb (70.308 kg)  11/27/14 155 lb (70.308 kg)  05/07/14 165 lb 9.6 oz (75.116 kg)  12/14/13 160 lb (72.576 kg)  08/28/13 155 lb (70.308 kg)  07/24/13 166 lb 14.2 oz (75.7 kg)  09/22/12 170 lb (77.111 kg)  04/06/12 170 lb (77.111 kg)    Usual Body Weight: unclear  % Usual Body Weight: -  BMI:  Body mass index is 24.21 kg/(m^2).  Estimated Nutritional Needs: Kcal: 1600-1800 Protein: 70-80 grams Fluid: > 1.36 L/day  Skin: WDL   Diet Order: Diet regular Room service appropriate?: Yes; Fluid consistency:: Thin  EDUCATION NEEDS: -No education needs identified at this time   Intake/Output Summary (Last 24 hours) at 01/13/15 1320 Last data filed at 01/13/15 1200  Gross per 24 hour  Intake   37.5 ml  Output    300 ml  Net -262.5 ml    Last BM: PTA   Labs:   Recent Labs Lab 01/13/15 0635  NA 139  K 3.9  CL 106   CO2 28  BUN 10  CREATININE 0.90  CALCIUM 9.3  MG 2.6*  GLUCOSE 109*    CBG (last 3)  No results for input(s): GLUCAP in the last 72 hours.  Scheduled Meds: . antiseptic oral rinse  7 mL Mouth Rinse BID  . [START ON 01/14/2015] azithromycin  500 mg Intravenous Q24H  . [START ON 01/14/2015] cefTRIAXone (ROCEPHIN)  IV  1 g Intravenous Q24H  . cefTRIAXone      . docusate sodium  100 mg Oral BID  . enoxaparin (LOVENOX) injection  40 mg Subcutaneous Q24H  . guaiFENesin  1,200 mg Oral BID  . methylPREDNISolone (SOLU-MEDROL) injection  60 mg Intravenous Q12H  . [START ON 01/14/2015] pneumococcal 23 valent vaccine  0.5 mL Intramuscular Tomorrow-1000  . sodium chloride  3 mL Intravenous Q12H  . sodium chloride  3 mL Intravenous Q12H    Continuous Infusions: . sodium chloride 75 mL/hr at 01/13/15 [address]   Kenlie Seki RD, LDN, CNSC [phone] Pager [phone] After Hours Pager

## 2015-01-13 NOTE — ED Notes (Signed)
Sats 92% on mask  Exp wheezing noted, Pt reports resting, feels better, less shaky

## 2015-01-13 NOTE — H&P (Signed)
[facility] History and Physical                                                                                    [name] [name], is a 39 y.o. female  MRN: [id number]   DOB - [birth year]  Admit Date - 01/13/2015  Outpatient Primary MD for the patient is Pcp Not In System  Referring Physician:    Chief Complaint:   Chief Complaint  Patient presents with  . 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 She presented to [facility] with acute respiratory distress on 4/23 am.  [name] [name] states that she awoke this morning at 2 AM unable to breathe thinking she was going to die. She has had some coughing for a few days but has now begun to bring up yellow sputum. She denies any chest pain or headache. She mentions she has been having episodes of mild respiratory distress approximately every 2-3 weeks. However, she is waiting on her Medicaid to be approved so she has only sought very limited medical attention.  She actively smoke tobacco up until 3 weeks ago. She tells me that her boyfriend still smokes both cigarettes and marijuana outside the house. 3-4 days ago she was suffering with a sore throat and fatigue. The sore throat has resolved. She knows of no sick contacts. She has not been vaccinated for the flu this year. The only medication that she has at home for her breathing is an albuterol inhaler.  At [facility] she was found to be in respiratory distress, with an elevated white count of 17.7 and an elevated hemoglobin of 16.1. Blood pressure 176/113. She was transferred to [facility] is a stepdown admission.   Review of Systems   In addition to the HPI above,  No Fever-chills, No Headache, No changes with Vision or hearing, No problems swallowing food or Liquids, No Chest pain No Abdominal pain, No Nausea or Vomiting, Bowel movements  are regular, No Blood in stool or Urine, No dysuria, No new skin rashes or bruises, No new joints pains-aches,  No new weakness, tingling, numbness in any extremity, No recent weight gain or loss, A full 10 point Review of Systems was done, except as stated above, all other Review of Systems were negative.  Past Medical History  Past Medical History  Diagnosis Date  . Hypertension   . Asthma   . COPD (chronic obstructive pulmonary disease)     History reviewed. No pertinent past surgical history.  Social History History  Substance Use Topics  . Smoking status: Former Smoker -- 0.00 packs/day    Quit date: 12/23/2014  . Smokeless tobacco: Never Used  . Alcohol Use: No   she stopped smoking 3 weeks ago. She denies recreational drugs. She has secondhand exposure to both tobacco smoke and marijuana smoke. She lives at home with her 46-year-old daughter and boyfriend, she is studying human resources through [employer] and will graduate in December.  Family History Brother has asthma.  Prior  to Admission medications   Medication Sig Start Date End Date Taking? Authorizing Provider  albuterol (PROVENTIL HFA;VENTOLIN HFA) 108 (90 BASE) MCG/ACT inhaler Inhale 2 puffs into the lungs every 6 (six) hours as needed for wheezing or shortness of breath. 11/27/14   Teressa LowerVrinda Pickering, NP    No Known Allergies  Physical Exam  Vitals  Blood pressure 169/100, pulse 128, temperature 98.2 F (36.8 C), temperature source Oral, resp. rate 24, height 5\' 6"  (1.676 m), weight 68 kg (149 lb 14.6 oz), last menstrual period 01/07/2015, SpO2 96 %.   General: Well-developed, well-nourished young African American female lying in bed in mild distress.  Psych:  Normal affect and insight, Not Suicidal or Homicidal, Awake Alert, Oriented X 3.  Neuro:   No F.N deficits, ALL C.Nerves Intact, Strength 5/5 all 4 extremities, Sensation intact all 4 extremities.  ENT:  Ears and Eyes appear Normal,  Conjunctivae clear, PER. Moist oral mucosa with minimal erythema  Neck:  Supple, No lymphadenopathy appreciated  Respiratory:  Bilateral wheeze worse on the right, minimally increased work of breathing.  Cardiac: Tachycardic, No Murmurs, no LE edema noted, no JVD.    Abdomen:  Positive bowel sounds, Soft, Non tender, Non distended,  No masses appreciated  Skin:  No Cyanosis, Normal Skin Turgor, No Skin Rash or Bruise.  Extremities:  Able to move all 4. 5/5 strength in each,  no effusions.  Data Review  CBC  Recent Labs Lab 01/13/15 0635  WBC 17.7*  HGB 16.1*  HCT 47.0*  PLT 389  MCV 92.9  MCH 31.8  MCHC 34.3  RDW 12.9  LYMPHSABS 3.5  MONOABS 1.2*  EOSABS 1.3*  BASOSABS 0.0    Chemistries   Recent Labs Lab 01/13/15 0635  NA 139  K 3.9  CL 106  CO2 28  GLUCOSE 109*  BUN 10  CREATININE 0.90  CALCIUM 9.3  MG 2.6*    Troponin 1, HIV and BNP are pending.  Urinalysis and UDS are pending.    Imaging results:   Dg Chest Port 1 View  01/13/2015   CLINICAL DATA:  Pt states she has had sob since last night. Hx of asthma and no relief from inhaler. Hx of copd and htn. Former smoker.  EXAM: PORTABLE CHEST - 1 VIEW  COMPARISON:  12/13/2014.  FINDINGS: Lungs are hyperexpanded. There is mild prominence of the bronchovascular markings bilaterally, overt pulmonary edema and no areas of lung consolidation. No pleural effusion or pneumothorax.  Cardiac silhouette is normal in size. Normal mediastinal and hilar contours.  Bony thorax is intact.  IMPRESSION: 1. Hyperexpanded lungs. 2. No pneumonia or edema.   Electronically Signed   By: Amie Portlandavid  Ormond M.D.   On: 01/13/2015 07:57   2-D echo pending   My personal review of EKG: EKG is pending   Assessment & Plan  Principal Problem:   Respiratory distress Active Problems:   Acute respiratory failure   Hypertensive urgency   Acute bronchitis  Respiratory distress likely secondary to acute bronchitis Admitted to  stepdown but will likely be able to transfer to the floor soon. Started on azithromycin and Rocephin, Solu-Medrol, nebulizers, Mucinex Patient denies chest pain but given her respiratory distress will check troponin 1, EKG, and 2-D echo. Low-dose Ativan prn  Hypertensive urgency Patient was previously on Norvasc but reports this was discontinued because her blood pressure improved Will place on IV hydralazine when necessary for SBP greater than 180 or DBP greater than 110. Will reconsider maintenance blood pressure  medications when acute exacerbation has improved prior to discharge.  History of tobacco abuse Patient reports she quit 3 weeks ago  Elevated hemoglobin  Could be due to hemoconcentration or history of tobacco abuse. Will place on gentle IV fluids for 12 hours.   Consultants Called:  none  Family Communication:   Patient is alert and oriented and understands her plan of care   Code Status:  Full   Condition:  Guarded   Potential Disposition: To home in 24-48 hours.   Time spent in minutes : [address],  PA-C on 01/13/2015 at 11:03 AM Between 7am to 7pm - Pager - [phone] After 7pm go to [email or web address] - password [email or web address] And look for the night coverage person covering me after hours  [facility]

## 2015-01-13 NOTE — Progress Notes (Signed)
38/F with COPD, no PCP, with COPD exacerbation initial sats 80% In mod distress, requiring 35%O2 ABG without hypercarbia or resp acidosis CXR clear EDP Dr.Palumbo thinks she needs STep down and [facility]  Zannie CovePreetha Kyllie Pettijohn, MD [phone]

## 2015-01-13 NOTE — ED Notes (Signed)
B/S Commode brought to room.  Pt up in room, reports feels a little shaky.  Back to bed safely, sats 94%, with mask.

## 2015-01-13 NOTE — ED Notes (Signed)
Patient states that she started to have SOB last night. Patient reports that she has had 3 puffs to her inhaler every hour all night.

## 2015-01-14 DIAGNOSIS — R7989 Other specified abnormal findings of blood chemistry: Secondary | ICD-10-CM

## 2015-01-14 DIAGNOSIS — R778 Other specified abnormalities of plasma proteins: Secondary | ICD-10-CM | POA: Diagnosis present

## 2015-01-14 DIAGNOSIS — F1910 Other psychoactive substance abuse, uncomplicated: Secondary | ICD-10-CM | POA: Diagnosis present

## 2015-01-14 DIAGNOSIS — I10 Essential (primary) hypertension: Secondary | ICD-10-CM

## 2015-01-14 DIAGNOSIS — J9621 Acute and chronic respiratory failure with hypoxia: Secondary | ICD-10-CM | POA: Diagnosis present

## 2015-01-14 DIAGNOSIS — F411 Generalized anxiety disorder: Secondary | ICD-10-CM

## 2015-01-14 DIAGNOSIS — J441 Chronic obstructive pulmonary disease with (acute) exacerbation: Principal | ICD-10-CM | POA: Diagnosis present

## 2015-01-14 DIAGNOSIS — R0600 Dyspnea, unspecified: Secondary | ICD-10-CM

## 2015-01-14 DIAGNOSIS — Z720 Tobacco use: Secondary | ICD-10-CM

## 2015-01-14 LAB — LACTIC ACID, PLASMA
Lactic Acid, Venous: 2.7 mmol/L (ref 0.5–2.0)
Lactic Acid, Venous: 2.5 mmol/L (ref 0.5–2.0)

## 2015-01-14 LAB — CBC WITH DIFFERENTIAL/PLATELET
Basophils Absolute: 0 10*3/uL (ref 0.0–0.1)
Basophils Relative: 0 % (ref 0–1)
EOS ABS: 0 10*3/uL (ref 0.0–0.7)
Eosinophils Relative: 0 % (ref 0–5)
HCT: 42.8 % (ref 36.0–46.0)
Hemoglobin: 14.2 g/dL (ref 12.0–15.0)
Lymphocytes Relative: 9 % — ABNORMAL LOW (ref 12–46)
Lymphs Abs: 3 10*3/uL (ref 0.7–4.0)
MCH: 31.9 pg (ref 26.0–34.0)
MCHC: 33.2 g/dL (ref 30.0–36.0)
MCV: 96.2 fL (ref 78.0–100.0)
Monocytes Absolute: 1.7 10*3/uL — ABNORMAL HIGH (ref 0.1–1.0)
Monocytes Relative: 5 % (ref 3–12)
Neutro Abs: 28.5 10*3/uL — ABNORMAL HIGH (ref 1.7–7.7)
Neutrophils Relative %: 86 % — ABNORMAL HIGH (ref 43–77)
PLATELETS: 381 10*3/uL (ref 150–400)
RBC: 4.45 MIL/uL (ref 3.87–5.11)
RDW: 13.9 % (ref 11.5–15.5)
WBC: 33.2 10*3/uL — AB (ref 4.0–10.5)

## 2015-01-14 LAB — BASIC METABOLIC PANEL
ANION GAP: 7 (ref 5–15)
BUN: 9 mg/dL (ref 6–23)
CALCIUM: 9.2 mg/dL (ref 8.4–10.5)
CHLORIDE: 110 mmol/L (ref 96–112)
CO2: 22 mmol/L (ref 19–32)
CREATININE: 0.79 mg/dL (ref 0.50–1.10)
GFR calc Af Amer: >90 mL/min (ref >90)
GFR calc non Af Amer: >90 mL/min (ref >90)
Glucose, Bld: 129 mg/dL — ABNORMAL HIGH (ref 70–99)
Potassium: 4.6 mmol/L (ref 3.5–5.1)
Sodium: 139 mmol/L (ref 135–145)

## 2015-01-14 MED ORDER — PAROXETINE HCL 20 MG PO TABS
20.0000 mg | ORAL_TABLET | Freq: Every day | ORAL | Status: DC
  Administered 2015-01-14: 20 mg via ORAL
  Filled 2015-01-14: qty 1

## 2015-01-14 MED ORDER — IPRATROPIUM-ALBUTEROL 0.5-2.5 (3) MG/3ML IN SOLN
3.0000 mL | Freq: Four times a day (QID) | RESPIRATORY_TRACT | Status: DC
  Administered 2015-01-14: 3 mL via RESPIRATORY_TRACT
  Filled 2015-01-14: qty 3

## 2015-01-14 NOTE — Clinical Social Work Note (Signed)
CSW received call from RN about patient's concerns for her 39 year old daughter. Patient's 39 year old daughter is at home with 39 yo child and the patient's boyfriend. The patient is concerned about the 39 yo boy watching the child alone. CSW explained to RN that unfortunately there isn't anything that CSW can do for the patient. The patient will need to make arrangements, by either allowing the 39 yo to watch the child for the night, or a CPS report can be made if the patient believes the child is in danger.    Roddie McBryant Danne Scardina MSW, [place], [place], [phone]

## 2015-01-14 NOTE — Progress Notes (Signed)
Utilization review completed.  

## 2015-01-14 NOTE — Progress Notes (Signed)
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  Presented to [facility] with acute respiratory distress on 4/23 am. [name] [name] states that she awoke this morning at 2 AM unable to breathe thinking she was going to die. She has had some coughing for a few days but has now begun to bring up yellow sputum. She denies any chest pain or headache. She mentions she has been having episodes of mild respiratory distress approximately every 2-3 weeks. However, she is waiting on her Medicaid to be approved so she has only sought very limited medical attention. She actively smoke tobacco up until 3 weeks ago. She tells me that her boyfriend still smokes both cigarettes and marijuana outside the house. 3-4 days ago she was suffering with a sore throat and fatigue. The sore throat has resolved. She knows of no sick contacts. She has not been vaccinated for the flu this year. The only medication that she has at home for her breathing is an albuterol inhaler.  At [facility] she was found to be in respiratory distress, with an elevated white count of 17.7 and an elevated hemoglobin of 16.1. Blood pressure 176/113. She was transferred to [facility] is a stepdown admission.  HPI/Subjective: 4/24 A/O 4, positive SOB, negative CP, negative N/V. States has been smoking for 20 years but stopped 3 weeks ago.  Assessment/Plan: Respiratory failure with hypoxia/ Acute Bronchitis/COPD exacerbation -Started on azithromycin and Rocephin,  -Solu-Medrol 60 mg BID  -DuoNeb QID  -Mucinex BID  -Ativan 0.5 mg  QID PRN -Left AMA   Hypertensive urgency -Left AMA   Positive troponin -Most likely secondary to demand ischemia brought on by hypertensive  urgency and respiratory failure -Trending troponin -Echocardiogram pending --Left AMA   History of tobacco abuse -Patient reports she quit 3 weeks ago  Substance abuse -UDS positive for opioids; [facility] patient has been prescribed Suboxone --Left AMA   Elevated hemoglobin  -Could be due to hemoconcentration or history of tobacco abuse. -Will place on gentle IV fluids for 12 hours. --Left AMA   Anxiety -Start Paxil 20 mg daily -Continue lorazepam 0.5 mg QID PRN  --Left AMA     Code Status: FULL Family Communication: no family present at time of exam Disposition Plan: -Left AMA     Consultants:   Procedure/Significant Events:    Culture 4/23 influenza A/B/H1 N1 negative 4/23 MRSA by PCR negative 4/23 urine pending 4/24 respiratory virus panel pending   Antibiotics: Azithromycin 4/24>> Ceftriaxone 4/24>>   DVT prophylaxis: Lovenox   Devices    LINES / TUBES:      Continuous Infusions: . sodium chloride 100 mL/hr at 01/14/15 1524    Objective: VITAL SIGNS: Temp: 98 F (36.7 C) (04/24 1529) Temp Source: Oral (04/24 1529) BP: 147/95 mmHg (04/24 1529) Pulse Rate: 93 (04/24 1600) SPO2; FIO2:   Intake/Output Summary (Last 24 hours) at 01/14/15 1830 Last data filed at 01/14/15 1700  Gross per 24 hour  Intake 2369.58 ml  Output      0 ml  Net 2369.58 ml     Exam: General: A/O 4, acute respiratory distress secondary to COPD exacerbation/bronchitis Lungs: diffuse decreased air movement, diffuse inspiratory/expiratory wheezing, without crackles Cardiovascular: Regular rate and rhythm without murmur gallop  or rub normal S1 and S2 Abdomen: Nontender, nondistended, soft, bowel sounds positive, no rebound, no ascites, no appreciable mass Extremities: No significant cyanosis, clubbing, or edema bilateral lower extremities  Data Reviewed: Basic Metabolic Panel:  Recent Labs Lab 01/13/15 0635 01/14/15 0258  NA 139 139  K 3.9 4.6  CL  106 110  CO2 28 22  GLUCOSE 109* 129*  BUN 10 9  CREATININE 0.90 0.79  CALCIUM 9.3 9.2  MG 2.6*  --    Liver Function Tests: No results for input(s): AST, ALT, ALKPHOS, BILITOT, PROT, ALBUMIN in the last 168 hours. No results for input(s): LIPASE, AMYLASE in the last 168 hours. No results for input(s): AMMONIA in the last 168 hours. CBC:  Recent Labs Lab 01/13/15 0635 01/14/15 1005  WBC 17.7* 33.2*  NEUTROABS 11.6* 28.5*  HGB 16.1* 14.2  HCT 47.0* 42.8  MCV 92.9 96.2  PLT 389 381   Cardiac Enzymes:  Recent Labs Lab 01/13/15 1308  TROPONINI 0.24*   BNP (last 3 results)  Recent Labs  01/13/15 1300  BNP 103.6*    ProBNP (last 3 results) No results for input(s): PROBNP in the last 8760 hours.  CBG: No results for input(s): GLUCAP in the last 168 hours.  Recent Results (from the past 240 hour(s))  MRSA PCR Screening     Status: None   Collection Time: 01/13/15 10:06 AM  Result Value Ref Range Status   MRSA by PCR NEGATIVE NEGATIVE Final    Comment:        The GeneXpert MRSA Assay (FDA approved for NASAL specimens only), is one component of a comprehensive MRSA colonization surveillance program. It is not intended to diagnose MRSA infection nor to guide or monitor treatment for MRSA infections.      Studies:  Recent x-ray studies have been reviewed in detail by the Attending Physician  Scheduled Meds:  Scheduled Meds: . antiseptic oral rinse  7 mL Mouth Rinse BID  . azithromycin  500 mg Intravenous Q24H  . cefTRIAXone (ROCEPHIN)  IV  1 g Intravenous Q24H  . docusate sodium  100 mg Oral BID  . enoxaparin (LOVENOX) injection  40 mg Subcutaneous Q24H  . feeding supplement (ENSURE ENLIVE)  237 mL Oral BID BM  . guaiFENesin  1,200 mg Oral BID  . ipratropium-albuterol  3 mL Nebulization QID  . methylPREDNISolone (SOLU-MEDROL) injection  60 mg Intravenous Q12H  . PARoxetine  20 mg Oral Daily  . sodium chloride  3 mL Intravenous Q12H  . sodium  chloride  3 mL Intravenous Q12H    Time spent on care of this patient: 40 mins   WOODS, Roselind MessierURTIS J , MD  [facility] Office  [phone] Pager - [phone]  On-Call/Text Page:      [email or web address]      password [email or web address]  If 7PM-7AM, please contact night-coverage [email or web address] Password [email or web address] 01/14/2015, 6:30 PM   LOS: 1 day   Care during the described time interval was provided by me .  I have reviewed this patient's available data, including medical history, events of note, physical examination, radiology studies and test results as part of my evaluation  Carolyne Littlesurtis Woods, MD [phone] Pager

## 2015-01-14 NOTE — Progress Notes (Signed)
Patient has decided to leave the hospital against medical advice. The patient states she has nobody at home to watch her 39 year old daughter since her boyfriend has to leave town for work tonight and her 39 year old son is not capable. Patient states she "feels fine and has been off oxygen for hours". Encouraged patient to make arrangements for childcare, however patient states she has no one to call, as her family lives out of state. Dr. Joseph ArtWoods made aware. The patient is competent and understands the risks of leaving, including permanent disability and/or death, and has had an opportunity to ask questions about her condition. AMA paperwork signed and placed in chart. Patient escorted off unit.

## 2015-01-14 NOTE — Progress Notes (Signed)
  Echocardiogram 2D Echocardiogram has been performed.  [name] [name] 01/14/2015, 4:06 PM 

## 2015-01-14 NOTE — Progress Notes (Signed)
Patient refusing respiratory virus panel. Reiterated that panel was needed to determine further plan of care. However, patient states "I had a panic attack, and almost died last time". Dr. Joseph ArtWoods made aware and at bedside. Will keep patient on droplet precautions and continue to monitor.

## 2015-01-14 NOTE — Care Management Note (Signed)
    Page 1 of 1   01/14/2015     9:31:30 AM CARE MANAGEMENT NOTE 01/14/2015  Patient:  [name] [name] [name],[name] [name]   Account Number:  [id number]  Date Initiated:  01/14/2015  Documentation initiated by:  [facility]  Subjective/Objective Assessment:   adm: acute respiratory failure secondary to asthma/COPD exacerbation     Action/Plan:   discharge planning   Anticipated DC Date:  01/15/2015   Anticipated DC Plan:  [place]  CM consult      Choice offered to / List presented to:             Status of service:  Completed, signed off Medicare Important Message given?   (If response is "NO", the following Medicare IM given date fields will be blank) Date Medicare IM given:   Medicare IM given by:   Date Additional Medicare IM given:   Additional Medicare IM given by:    Discharge Disposition:  HOME/SELF CARE  Per UR Regulation:    If discussed at Long Length of Stay Meetings, dates discussed:    Comments:  01/14/1608:28 Pt states she will go to walk in tomorrow morning at the clinic to secure an appointment for follow up care.  No other Cm needs were communicated.  Mariane Masters, BSN, CM [phone].  09:00 Cm met with pt in room and gave pt [facility] pamphlet and pt verbalized understanding she can go to the clinic any weekday morning from 9-10 to secure an appointment for follow up care.  If pt discharges tomorrow, Cm can schedule a follow up appt (not open on weekend); if discharge is today, pt can use walk in time at clinic.  MD requested financial help with medications but pt has Medicaid and is not eligible for MATCH.  No other CM needs were communicated. Tempie Hoist, BSN, [place].

## 2015-01-14 NOTE — Progress Notes (Signed)
CRITICAL VALUE ALERT  Critical value received:  Lactic acid 2.7   Date of notification:  01/14/15  Time of notification:  1128  Critical value read back:Yes.    Nurse who received alert:  Toula MoosABERION, Dove Gresham J  MD notified (1st page):  Dr. Joseph ArtWoods  Time of first page:  1133  Responding MD:  Dr. Joseph ArtWoods  Time MD responded:  1140

## 2015-01-15 LAB — URINE CULTURE

## 2015-01-18 LAB — HIV 1/2 AB DIFFERENTIATION
HIV 1 Ab: NONREACTIVE
HIV 2 Ab: NONREACTIVE

## 2015-01-18 LAB — HIV ANTIBODY (ROUTINE TESTING W REFLEX): HIV Screen 4th Generation wRfx: REACTIVE — AB

## 2015-01-18 LAB — RNA QUALITATIVE: HIV 1 RNA Qualitative: NEGATIVE

## 2015-01-21 NOTE — Discharge Summary (Signed)
Physician Discharge Summary  [name] [name] ZOX:[id number] DOB: [birth year] DOA: 01/13/2015  PCP: Pcp Not In System  Admit date: 01/13/2015 Discharge date: 01/21/2015   Assessment/Plan: Respiratory failure with hypoxia/ Acute Bronchitis/COPD exacerbation -Started on azithromycin and Rocephin,  -Solu-Medrol 60 mg BID  -DuoNeb QID  -Mucinex BID  -Ativan 0.5 mg QID PRN -Left AMA   Hypertensive urgency -Left AMA   Positive troponin -Most likely secondary to demand ischemia brought on by hypertensive urgency and respiratory failure -Trending troponin -Echocardiogram pending --Left AMA   History of tobacco abuse -Patient reports she quit 3 weeks ago  Substance abuse -UDS positive for opioids; [facility] patient has been prescribed Suboxone --Left AMA   Elevated hemoglobin  -Could be due to hemoconcentration or history of tobacco abuse. -Will place on gentle IV fluids for 12 hours. --Left AMA   Anxiety -Start Paxil 20 mg daily -Continue lorazepam 0.5 mg QID PRN  --Left AMA     Code Status: FULL Family Communication: no family present at time of exam Disposition Plan: -Left AMA      Discharge Diagnoses:  Principal Problem:   Respiratory distress Active Problems:   Acute respiratory failure   Tobacco abuse   COPD (chronic obstructive pulmonary disease)   Hypertensive urgency   Acute bronchitis   Acute respiratory failure with hypoxia   Asthma with acute exacerbation   Acute on chronic respiratory failure with hypoxia   COPD exacerbation   Elevated troponin   Substance abuse   Anxiety state   Filed Weights   01/13/15 0624 01/13/15 1015  Weight: 65.772 kg (145 lb) 68 kg (149 lb 14.6 oz)   Admit HPI / Brief Narrative: 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  Presented to [facility] with acute respiratory distress on 4/23 am. [name] [name] states  that she awoke this morning at 2 AM unable to breathe thinking she was going to die. She has had some coughing for a few days but has now begun to bring up yellow sputum. She denies any chest pain or headache. She mentions she has been having episodes of mild respiratory distress approximately every 2-3 weeks. However, she is waiting on her Medicaid to be approved so she has only sought very limited medical attention. She actively smoke tobacco up until 3 weeks ago. She tells me that her boyfriend still smokes both cigarettes and marijuana outside the house. 3-4 days ago she was suffering with a sore throat and fatigue. The sore throat has resolved. She knows of no sick contacts. She has not been vaccinated for the flu this year. The only medication that she has at home for her breathing is an albuterol inhaler.  At [facility] she was found to be in respiratory distress, with an elevated white count of 17.7 and an elevated hemoglobin of 16.1. Blood pressure 176/113. She was transferred to [facility] is a stepdown admission.  HPI/Subjective: 4/24 A/O 4, positive SOB, negative CP, negative N/V. States has been smoking for 20 years but stopped 3 weeks ago.   Consultants:   Procedure/Significant Events:    Culture 4/23 influenza A/B/H1 N1 negative 4/23 MRSA by PCR negative 4/23 urine pending 4/24 respiratory virus panel pending   Antibiotics: Azithromycin 4/24>> Ceftriaxone 4/24>>   DVT prophylaxis: Lovenox     Discharge Exam: Filed Vitals:   01/14/15 1126 01/14/15 1517 01/14/15 1529 01/14/15 1600  BP: 130/80  147/95  Pulse: 80   93  Temp: 97.8 F (36.6 C)  98 F (36.7 C)   TempSrc: Oral  Oral   Resp: 15   18  Height:      Weight:      SpO2: 100% 99%  96%    General: A/O 4, acute respiratory distress secondary to COPD exacerbation/bronchitis Lungs: diffuse decreased air movement, diffuse inspiratory/expiratory wheezing, without crackles Cardiovascular:  Regular rate and rhythm without murmur gallop or rub normal S1 and S2 Abdomen: Nontender, nondistended, soft, bowel sounds positive, no rebound, no ascites, no appreciable mass Extremities: No significant cyanosis, clubbing, or edema bilateral lower extremities  Discharge Instructions     Medication List    ASK your doctor about these medications        albuterol 108 (90 BASE) MCG/ACT inhaler  Commonly known as:  PROVENTIL HFA;VENTOLIN HFA  Inhale 2 puffs into the lungs every 6 (six) hours as needed for wheezing or shortness of breath.     NYQUIL D COLD/FLU PO  Take 5 mLs by mouth at bedtime as needed (cold symptoms (cough)).     SUBOXONE 8-2 MG Film  Generic drug:  Buprenorphine HCl-Naloxone HCl  1.75 Film daily. Dissolve on the tongue.       No Known Allergies    The results of significant diagnostics from this hospitalization (including imaging, microbiology, ancillary and laboratory) are listed below for reference.    Significant Diagnostic Studies: Dg Chest Port 1 View  01/13/2015   CLINICAL DATA:  Pt states she has had sob since last night. Hx of asthma and no relief from inhaler. Hx of copd and htn. Former smoker.  EXAM: PORTABLE CHEST - 1 VIEW  COMPARISON:  12/13/2014.  FINDINGS: Lungs are hyperexpanded. There is mild prominence of the bronchovascular markings bilaterally, overt pulmonary edema and no areas of lung consolidation. No pleural effusion or pneumothorax.  Cardiac silhouette is normal in size. Normal mediastinal and hilar contours.  Bony thorax is intact.  IMPRESSION: 1. Hyperexpanded lungs. 2. No pneumonia or edema.   Electronically Signed   By: Amie Portlandavid  Ormond M.D.   On: 01/13/2015 07:57    Microbiology: Recent Results (from the past 240 hour(s))  MRSA PCR Screening     Status: None   Collection Time: 01/13/15 10:06 AM  Result Value Ref Range Status   MRSA by PCR NEGATIVE NEGATIVE Final    Comment:        The GeneXpert MRSA Assay (FDA approved for NASAL  specimens only), is one component of a comprehensive MRSA colonization surveillance program. It is not intended to diagnose MRSA infection nor to guide or monitor treatment for MRSA infections.   Urine culture     Status: None   Collection Time: 01/13/15 10:48 AM  Result Value Ref Range Status   Specimen Description URINE, CLEAN CATCH  Final   Special Requests NONE  Final   Colony Count   Final    >=100,000 COLONIES/ML Performed at [facility]    Culture   Final    Multiple bacterial morphotypes present, none predominant. Suggest appropriate recollection if clinically indicated. Performed at [facility]    Report Status 01/15/2015 FINAL  Final     Labs: Basic Metabolic Panel: No results for input(s): NA, K, CL, CO2, GLUCOSE, BUN, CREATININE, CALCIUM, MG, PHOS in the last 168 hours. Liver Function Tests: No results for input(s): AST, ALT, ALKPHOS, BILITOT, PROT, ALBUMIN in the last 168 hours. No results for input(s): LIPASE, AMYLASE  in the last 168 hours. No results for input(s): AMMONIA in the last 168 hours. CBC: No results for input(s): WBC, NEUTROABS, HGB, HCT, MCV, PLT in the last 168 hours. Cardiac Enzymes: No results for input(s): CKTOTAL, CKMB, CKMBINDEX, TROPONINI in the last 168 hours. BNP: BNP (last 3 results)  Recent Labs  01/13/15 1300  BNP 103.6*    ProBNP (last 3 results) No results for input(s): PROBNP in the last 8760 hours.  CBG: No results for input(s): GLUCAP in the last 168 hours.     Signed:  Carolyne Littles, MD [facility] [phone] pager

## 2015-02-07 ENCOUNTER — Telehealth: Payer: Self-pay | Admitting: Physician Assistant

## 2015-02-07 NOTE — Telephone Encounter (Signed)
Attempted to reach Ms. Main regarding positive 4th generation HIV screening test.  Left voice mail that I was attempting to reach her.  After discussing this with Dr. Ninetta LightsHatcher of ID, I will recommend Ms. Cherre HugerMack have another HIV test done thru her PCP.  Algis DownsMarianne York, PA-C [facility] Pager: [phone]

## 2015-02-07 NOTE — Telephone Encounter (Signed)
I was able to speak with Ms. [name] [name] about her positive 4th generation HIV screening test.  She stated this happened to her once before 3 years ago and the follow up test was negative.  I advised her to have another HIV follow up test and she she she would schedule it thru her PCP.  Algis DownsMarianne York, PA-C [facility] Pager: [phone]

## 2017-01-24 IMAGING — CR DG CHEST 2V
2 series · 2 of 2 positions shown · non-contrast
Comparison: 05/07/2014

CLINICAL DATA: Shortness of breath, wheezing, cough since
yesterday. History of asthma.

EXAM:
CHEST  2 VIEW

[w chest pa]
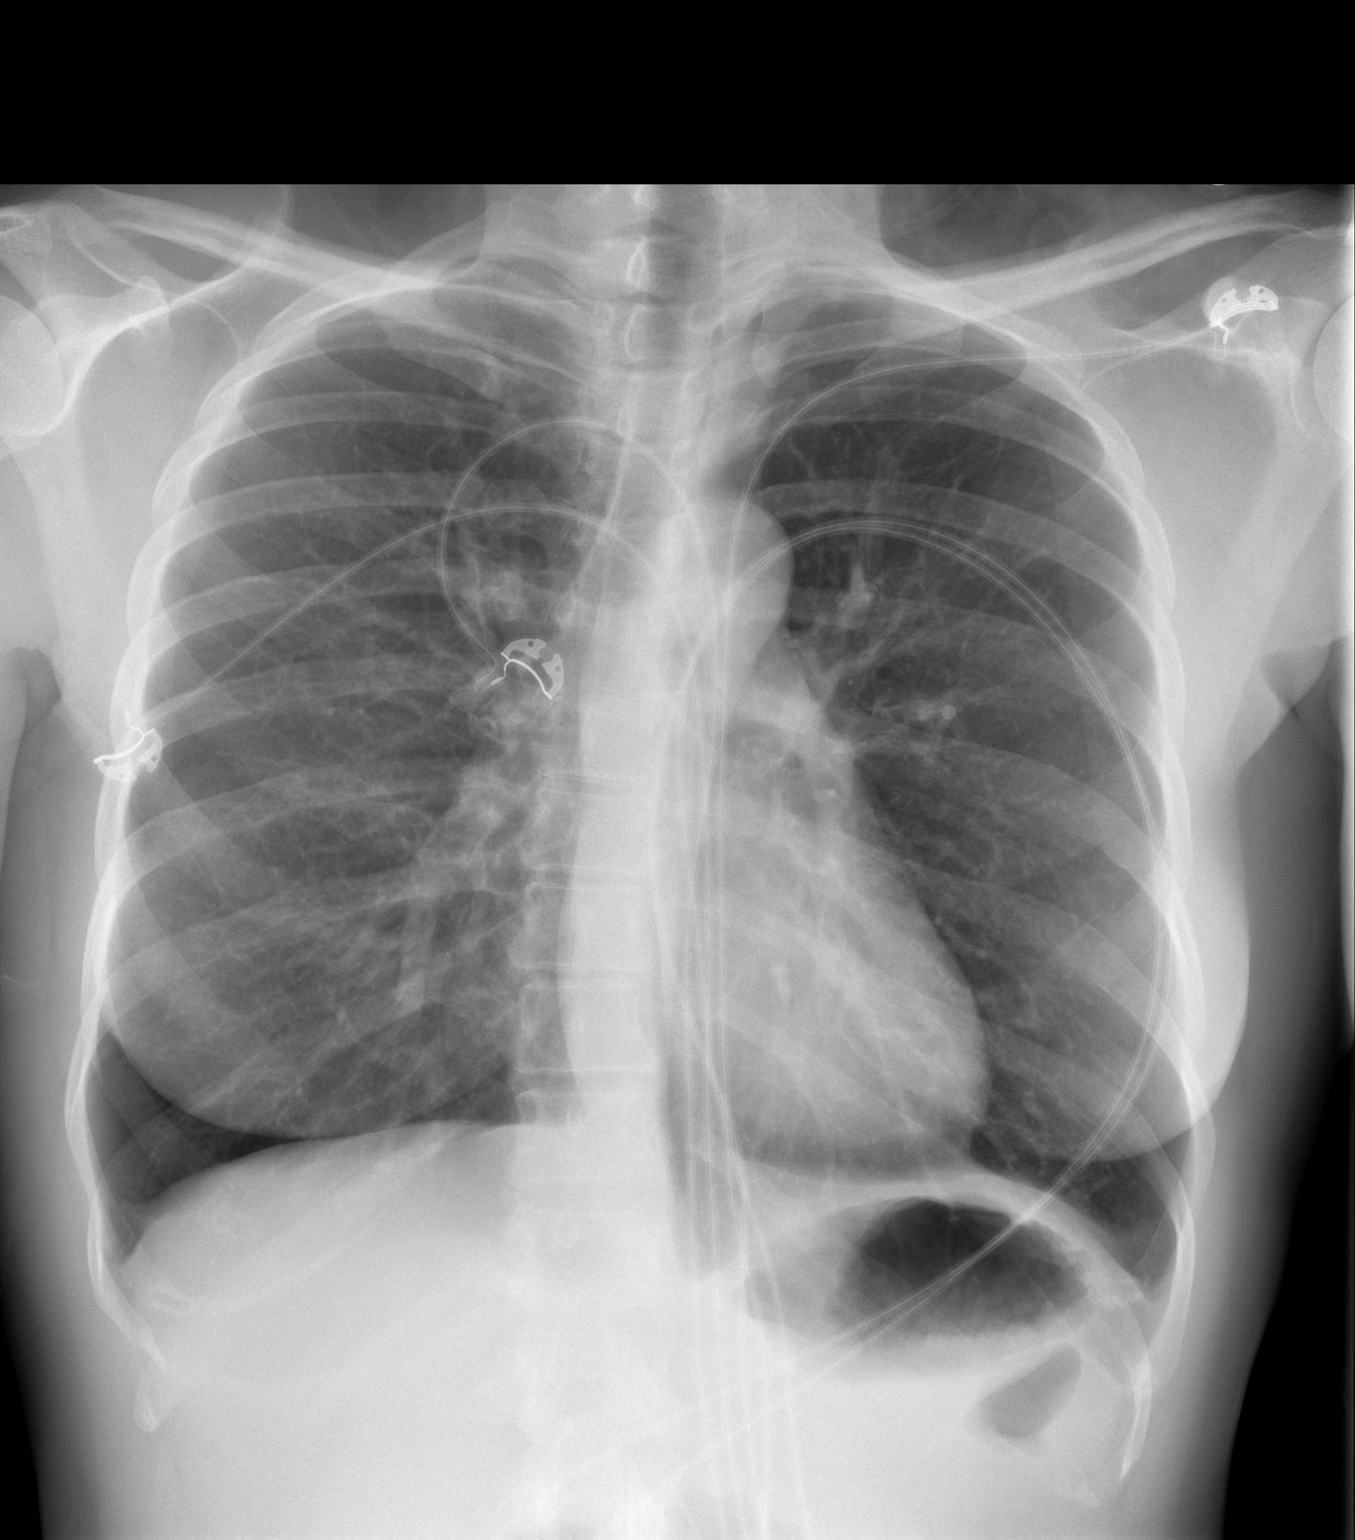

[w chest lat]
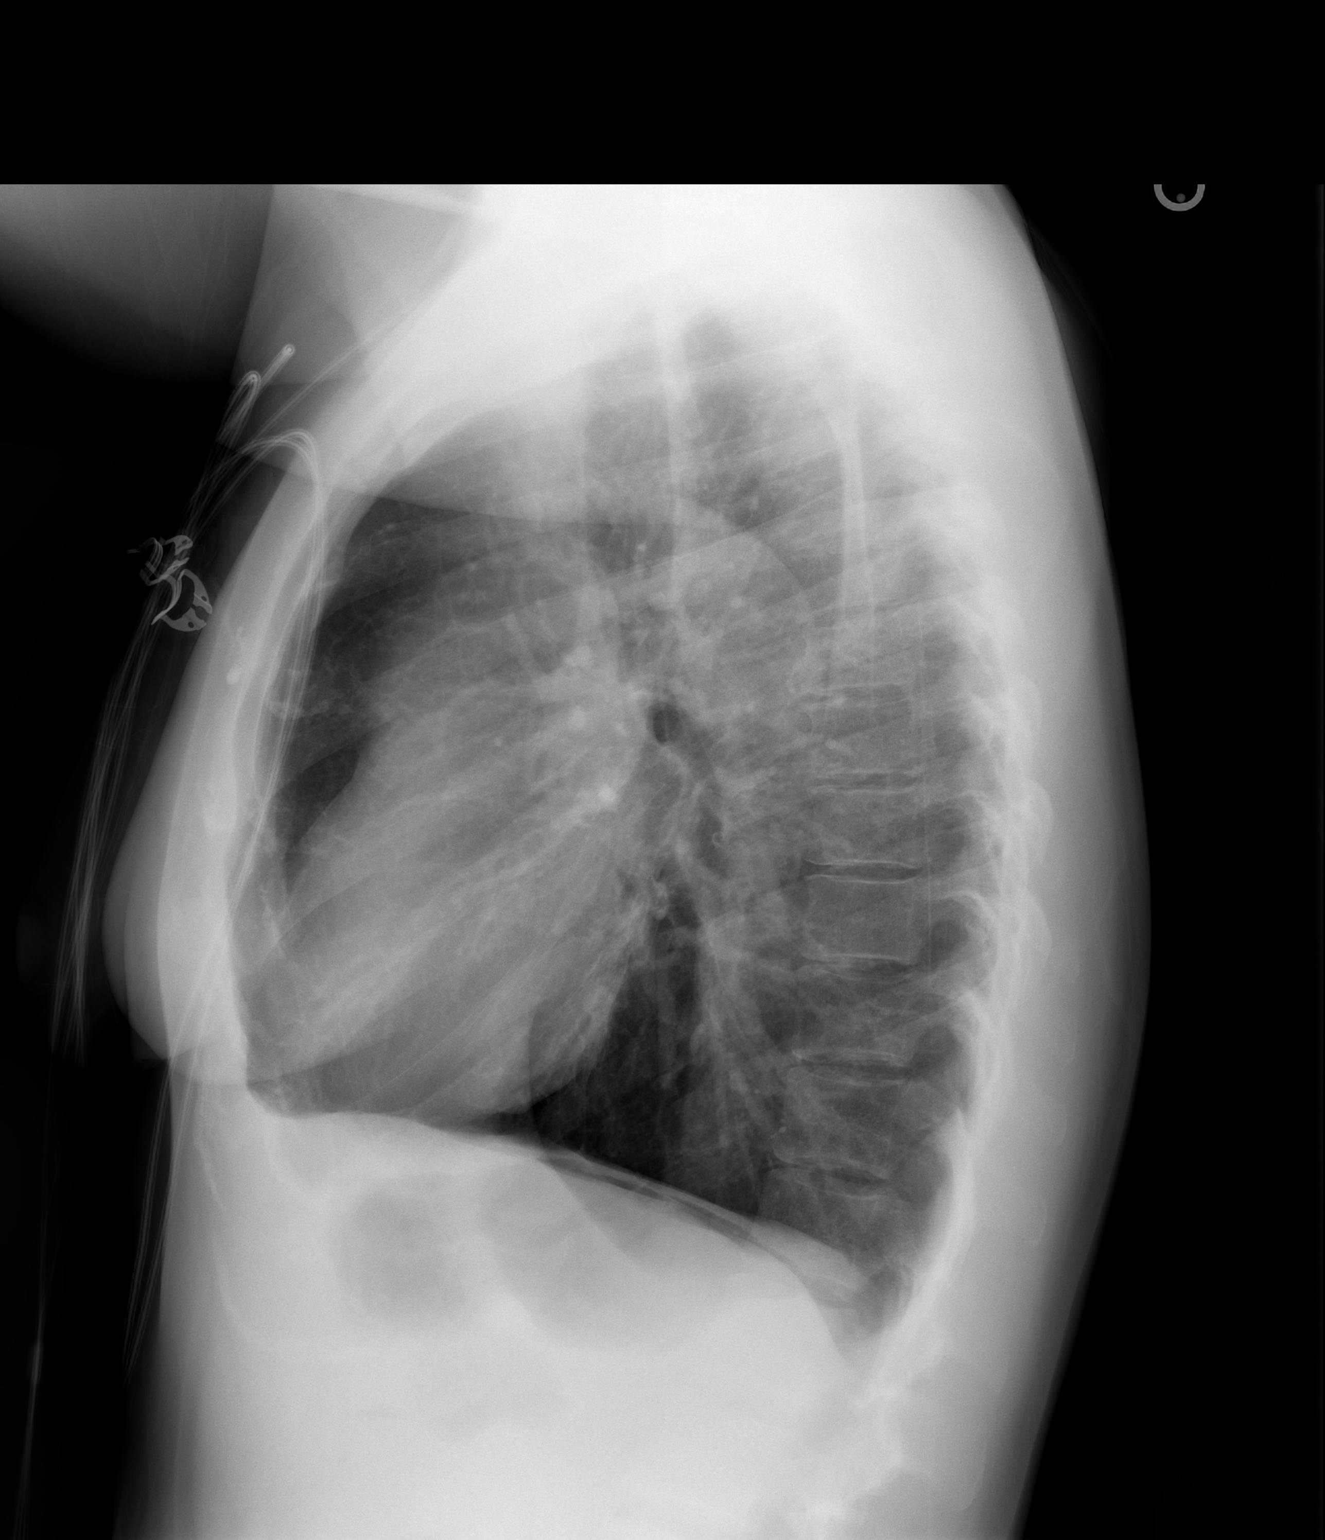

[2 of 2 positions shown; findings below may reference images not displayed]

FINDINGS: The heart size and mediastinal contours are within normal limits.
Both lungs are clear. The visualized skeletal structures are
unremarkable.
IMPRESSION: No active cardiopulmonary disease.
